# Patient Record
Sex: Female | Born: 1940
Health system: Southern US, Community
[De-identification: ages and names within clinical notes are randomized; demographics above are authoritative.]

## PROBLEM LIST (undated history)

## (undated) DIAGNOSIS — D649 Anemia, unspecified: Secondary | ICD-10-CM

## (undated) DIAGNOSIS — E079 Disorder of thyroid, unspecified: Secondary | ICD-10-CM

## (undated) DIAGNOSIS — C801 Malignant (primary) neoplasm, unspecified: Secondary | ICD-10-CM

## (undated) DIAGNOSIS — R011 Cardiac murmur, unspecified: Secondary | ICD-10-CM

## (undated) DIAGNOSIS — I639 Cerebral infarction, unspecified: Secondary | ICD-10-CM

## (undated) DIAGNOSIS — E7800 Pure hypercholesterolemia, unspecified: Secondary | ICD-10-CM

## (undated) DIAGNOSIS — I10 Essential (primary) hypertension: Secondary | ICD-10-CM

## (undated) DIAGNOSIS — M810 Age-related osteoporosis without current pathological fracture: Secondary | ICD-10-CM

## (undated) DIAGNOSIS — F32A Depression, unspecified: Secondary | ICD-10-CM

## (undated) DIAGNOSIS — F410 Panic disorder [episodic paroxysmal anxiety] without agoraphobia: Secondary | ICD-10-CM

## (undated) HISTORY — PX: CATARACT EXTRACTION: SUR2

---

## 1998-08-14 ENCOUNTER — Other Ambulatory Visit: Admission: RE | Admit: 1998-08-14 | Discharge: 1998-08-14 | Payer: Self-pay | Admitting: Obstetrics and Gynecology

## 1999-08-20 ENCOUNTER — Other Ambulatory Visit: Admission: RE | Admit: 1999-08-20 | Discharge: 1999-08-20 | Payer: Self-pay | Admitting: Obstetrics and Gynecology

## 2000-08-19 ENCOUNTER — Other Ambulatory Visit: Admission: RE | Admit: 2000-08-19 | Discharge: 2000-08-19 | Payer: Self-pay | Admitting: *Deleted

## 2001-08-20 ENCOUNTER — Other Ambulatory Visit: Admission: RE | Admit: 2001-08-20 | Discharge: 2001-08-20 | Payer: Self-pay | Admitting: Obstetrics and Gynecology

## 2002-08-22 ENCOUNTER — Other Ambulatory Visit: Admission: RE | Admit: 2002-08-22 | Discharge: 2002-08-22 | Payer: Self-pay | Admitting: Obstetrics and Gynecology

## 2002-09-13 ENCOUNTER — Ambulatory Visit (HOSPITAL_COMMUNITY): Admission: RE | Admit: 2002-09-13 | Discharge: 2002-09-13 | Payer: Self-pay | Admitting: Gastroenterology

## 2003-09-07 ENCOUNTER — Other Ambulatory Visit: Admission: RE | Admit: 2003-09-07 | Discharge: 2003-09-07 | Payer: Self-pay | Admitting: Obstetrics and Gynecology

## 2004-02-21 ENCOUNTER — Ambulatory Visit (HOSPITAL_COMMUNITY): Admission: RE | Admit: 2004-02-21 | Discharge: 2004-02-21 | Payer: Self-pay | Admitting: Allergy

## 2004-04-05 ENCOUNTER — Ambulatory Visit: Payer: Self-pay | Admitting: Pulmonary Disease

## 2004-05-14 ENCOUNTER — Ambulatory Visit: Payer: Self-pay | Admitting: Pulmonary Disease

## 2004-05-24 ENCOUNTER — Encounter: Admission: RE | Admit: 2004-05-24 | Discharge: 2004-05-24 | Payer: Self-pay | Admitting: Internal Medicine

## 2004-07-02 ENCOUNTER — Ambulatory Visit: Payer: Self-pay | Admitting: Pulmonary Disease

## 2004-09-20 ENCOUNTER — Ambulatory Visit: Payer: Self-pay | Admitting: Pulmonary Disease

## 2005-03-06 ENCOUNTER — Ambulatory Visit: Payer: Self-pay | Admitting: Pulmonary Disease

## 2006-07-21 ENCOUNTER — Encounter: Admission: RE | Admit: 2006-07-21 | Discharge: 2006-07-21 | Payer: Self-pay | Admitting: Internal Medicine

## 2006-11-05 ENCOUNTER — Ambulatory Visit: Payer: Self-pay | Admitting: Emergency Medicine

## 2008-12-06 ENCOUNTER — Ambulatory Visit (HOSPITAL_COMMUNITY): Admission: RE | Admit: 2008-12-06 | Discharge: 2008-12-06 | Payer: Self-pay | Admitting: Obstetrics and Gynecology

## 2009-02-01 ENCOUNTER — Encounter: Admission: RE | Admit: 2009-02-01 | Discharge: 2009-02-01 | Payer: Self-pay | Admitting: Internal Medicine

## 2009-02-02 ENCOUNTER — Inpatient Hospital Stay (HOSPITAL_COMMUNITY): Admission: EM | Admit: 2009-02-02 | Discharge: 2009-02-06 | Payer: Self-pay | Admitting: Emergency Medicine

## 2009-03-26 ENCOUNTER — Encounter (INDEPENDENT_AMBULATORY_CARE_PROVIDER_SITE_OTHER): Payer: Self-pay | Admitting: Neurology

## 2009-03-26 ENCOUNTER — Ambulatory Visit: Payer: Self-pay

## 2009-03-26 ENCOUNTER — Ambulatory Visit: Payer: Self-pay | Admitting: Cardiology

## 2009-03-26 ENCOUNTER — Ambulatory Visit (HOSPITAL_COMMUNITY): Admission: RE | Admit: 2009-03-26 | Discharge: 2009-03-26 | Payer: Self-pay | Admitting: Neurology

## 2009-03-28 ENCOUNTER — Encounter: Admission: RE | Admit: 2009-03-28 | Discharge: 2009-03-28 | Payer: Self-pay | Admitting: Neurology

## 2009-05-18 ENCOUNTER — Ambulatory Visit (HOSPITAL_BASED_OUTPATIENT_CLINIC_OR_DEPARTMENT_OTHER): Admission: RE | Admit: 2009-05-18 | Discharge: 2009-05-18 | Payer: Self-pay | Admitting: Urology

## 2010-07-15 LAB — POCT I-STAT 4, (NA,K, GLUC, HGB,HCT)
Glucose, Bld: 97 mg/dL (ref 70–99)
HCT: 37 % (ref 36.0–46.0)
Hemoglobin: 12.6 g/dL (ref 12.0–15.0)
Potassium: 5.3 mEq/L — ABNORMAL HIGH (ref 3.5–5.1)
Sodium: 136 mEq/L (ref 135–145)

## 2010-08-01 LAB — CORTISOL-AM, BLOOD: Cortisol - AM: 16.4 ug/dL (ref 4.3–22.4)

## 2010-08-01 LAB — BASIC METABOLIC PANEL
BUN: 10 mg/dL (ref 6–23)
BUN: 13 mg/dL (ref 6–23)
BUN: 5 mg/dL — ABNORMAL LOW (ref 6–23)
BUN: 8 mg/dL (ref 6–23)
CO2: 25 mEq/L (ref 19–32)
CO2: 25 mEq/L (ref 19–32)
CO2: 25 mEq/L (ref 19–32)
CO2: 26 mEq/L (ref 19–32)
Calcium: 8 mg/dL — ABNORMAL LOW (ref 8.4–10.5)
Calcium: 8 mg/dL — ABNORMAL LOW (ref 8.4–10.5)
Calcium: 8.1 mg/dL — ABNORMAL LOW (ref 8.4–10.5)
Calcium: 9.2 mg/dL (ref 8.4–10.5)
Chloride: 83 mEq/L — ABNORMAL LOW (ref 96–112)
Chloride: 92 mEq/L — ABNORMAL LOW (ref 96–112)
Chloride: 96 mEq/L (ref 96–112)
Chloride: 97 mEq/L (ref 96–112)
Creatinine, Ser: 0.47 mg/dL (ref 0.4–1.2)
Creatinine, Ser: 0.5 mg/dL (ref 0.4–1.2)
Creatinine, Ser: 0.55 mg/dL (ref 0.4–1.2)
Creatinine, Ser: 0.56 mg/dL (ref 0.4–1.2)
GFR calc Af Amer: >60 mL/min (ref >60)
GFR calc Af Amer: >60 mL/min (ref >60)
GFR calc Af Amer: >60 mL/min (ref >60)
GFR calc Af Amer: >60 mL/min (ref >60)
GFR calc non Af Amer: >60 mL/min (ref >60)
GFR calc non Af Amer: >60 mL/min (ref >60)
GFR calc non Af Amer: >60 mL/min (ref >60)
GFR calc non Af Amer: >60 mL/min (ref >60)
Glucose, Bld: 101 mg/dL — ABNORMAL HIGH (ref 70–99)
Glucose, Bld: 103 mg/dL — ABNORMAL HIGH (ref 70–99)
Glucose, Bld: 158 mg/dL — ABNORMAL HIGH (ref 70–99)
Glucose, Bld: 90 mg/dL (ref 70–99)
Potassium: 3.4 mEq/L — ABNORMAL LOW (ref 3.5–5.1)
Potassium: 3.7 mEq/L (ref 3.5–5.1)
Potassium: 4.2 mEq/L (ref 3.5–5.1)
Potassium: 4.2 mEq/L (ref 3.5–5.1)
Sodium: 119 mEq/L — CL (ref 135–145)
Sodium: 122 mEq/L — ABNORMAL LOW (ref 135–145)
Sodium: 126 mEq/L — ABNORMAL LOW (ref 135–145)
Sodium: 128 mEq/L — ABNORMAL LOW (ref 135–145)

## 2010-08-01 LAB — DIFFERENTIAL
Basophils Absolute: 0 10*3/uL (ref 0.0–0.1)
Basophils Relative: 0 % (ref 0–1)
Eosinophils Absolute: 0 10*3/uL (ref 0.0–0.7)
Eosinophils Relative: 0 % (ref 0–5)
Lymphocytes Relative: 11 % — ABNORMAL LOW (ref 12–46)
Lymphs Abs: 1 10*3/uL (ref 0.7–4.0)
Monocytes Absolute: 0.7 10*3/uL (ref 0.1–1.0)
Monocytes Relative: 8 % (ref 3–12)
Neutro Abs: 8 10*3/uL — ABNORMAL HIGH (ref 1.7–7.7)
Neutrophils Relative %: 82 % — ABNORMAL HIGH (ref 43–77)

## 2010-08-01 LAB — URINALYSIS, ROUTINE W REFLEX MICROSCOPIC
Bilirubin Urine: NEGATIVE
Glucose, UA: NEGATIVE mg/dL
Hgb urine dipstick: NEGATIVE
Ketones, ur: NEGATIVE mg/dL
Nitrite: NEGATIVE
Protein, ur: NEGATIVE mg/dL
Specific Gravity, Urine: 1.018 (ref 1.005–1.030)
Urobilinogen, UA: 0.2 mg/dL (ref 0.0–1.0)
pH: 6 (ref 5.0–8.0)

## 2010-08-01 LAB — CBC
HCT: 34.4 % — ABNORMAL LOW (ref 36.0–46.0)
HCT: 39.3 % (ref 36.0–46.0)
Hemoglobin: 12.2 g/dL (ref 12.0–15.0)
Hemoglobin: 13.5 g/dL (ref 12.0–15.0)
MCHC: 34.3 g/dL (ref 30.0–36.0)
MCHC: 35.4 g/dL (ref 30.0–36.0)
MCV: 90.6 fL (ref 78.0–100.0)
MCV: 90.8 fL (ref 78.0–100.0)
Platelets: 323 10*3/uL (ref 150–400)
Platelets: 342 10*3/uL (ref 150–400)
RBC: 3.79 MIL/uL — ABNORMAL LOW (ref 3.87–5.11)
RBC: 4.33 MIL/uL (ref 3.87–5.11)
RDW: 13.2 % (ref 11.5–15.5)
RDW: 13.4 % (ref 11.5–15.5)
WBC: 7.6 10*3/uL (ref 4.0–10.5)
WBC: 9.9 10*3/uL (ref 4.0–10.5)

## 2010-08-01 LAB — HEPATIC FUNCTION PANEL
ALT: 23 U/L (ref 0–35)
AST: 27 U/L (ref 0–37)
Albumin: 3.4 g/dL — ABNORMAL LOW (ref 3.5–5.2)
Alkaline Phosphatase: 69 U/L (ref 39–117)
Bilirubin, Direct: 0.1 mg/dL (ref 0.0–0.3)
Indirect Bilirubin: 0.6 mg/dL (ref 0.3–0.9)
Total Bilirubin: 0.7 mg/dL (ref 0.3–1.2)
Total Protein: 5.6 g/dL — ABNORMAL LOW (ref 6.0–8.3)

## 2010-08-01 LAB — OSMOLALITY, URINE: Osmolality, Ur: 254 mOsm/kg — ABNORMAL LOW (ref 390–1090)

## 2010-08-01 LAB — OSMOLALITY: Osmolality: 248 mOsm/kg — ABNORMAL LOW (ref 275–300)

## 2010-08-01 LAB — MAGNESIUM: Magnesium: 2.1 mg/dL (ref 1.5–2.5)

## 2010-08-01 LAB — TSH: TSH: 2.182 u[IU]/mL (ref 0.350–4.500)

## 2010-08-01 LAB — AMMONIA: Ammonia: 15 umol/L (ref 11–35)

## 2010-08-01 LAB — SODIUM, URINE, RANDOM: Sodium, Ur: 20 mEq/L

## 2010-09-10 NOTE — Assessment & Plan Note (Signed)
[facility]                             PULMONARY OFFICE NOTE   JENEE, SPAUGH                       MRN:          [id number]  DATE:11/05/2006                            DOB:          1941-01-02    SUBJECTIVE:  Ms. Perrault is a very pleasant 70 year old woman who was  seen formerly by Dr. Danice Goltz for chronic cough in the setting of  allergic rhinitis and gastroesophageal reflux. She tells me that her  exertional tolerance is good. She is able to walk on a treadmill every  day. She has had persistent cough despite treatment for both her  allergic rhinitis and her GERD. Her cough did not seem to change  regardless of these interventions. She stopped her Prilosec two months  ago because she was concerned about possible side effects and her cough  remained the same. She has continued her loratadine and Nasonex.  Otherwise, she does not complain of any significant pulmonary symptoms.  She denies any overt heartburn.   MEDICATIONS:  1. Nasonex 2 sprays each nostril daily.  2. Lipitor 10 mg daily.  3. Aspirin 81 mg daily.  4. Multivitamin daily.  5. Calcium t.i.d.  6. Synthroid 0.05 mg daily.  7. Alendronate 70 mg weekly.  8. Claritin or generic loratadine 10 mg once daily.   PHYSICAL EXAMINATION:  GENERAL:  This is a thin elderly well appearing  woman in no distress.  VITAL SIGNS:  Weight 107 pounds, temperature 98.1, blood pressure  120/72, heart rate 60, SPO2 99% on room air.  HEENT:  Her posterior pharynx is without any abnormalities. She does not  have any significant evidence of post-nasal drip.  NECK:  She has no strider or lymphadenopathy.  LUNGS:  Clear to auscultation bilaterally.  HEART:  Regular rate and rhythm without murmur.  ABDOMEN:  Benign.  EXTREMITIES:  No cyanosis, clubbing, or edema.   IMPRESSION:  Chronic cough with probable influences by both post-nasal  drip and to some degree gastroesophageal reflux. She has not  missed the  omeprazole since this medication was discontinued. Her cough has  remained stable. I have asked her to continue her loratadine and  Nasonex for her allergic rhinitis, and I indicated that it is okay for  her to stay off her Prilosec. If  her cough worsens in any way, then I think we should reinitiate her  proton pump inhibitor. Ms. Dore will follow with me on an as needed  basis.     Leslye Peer, MD  Electronically Signed    RSB/MedQ  DD: 11/05/2006  DT: 11/06/2006  Job #: [id number]

## 2010-09-13 NOTE — Op Note (Signed)
   NAME:  Nichole Foster, HANSELL                          ACCOUNT NO.:  [id number]   MEDICAL RECORD NO.:  [id number]                   PATIENT TYPE:  AMB   LOCATION:  ENDO                                 FACILITY:  [facility]   PHYSICIAN:  John C. Madilyn Fireman, M.D.                 DATE OF BIRTH:  07/05/1940   DATE OF PROCEDURE:  09/13/2002  DATE OF DISCHARGE:                                 OPERATIVE REPORT   PROCEDURE:  Colonoscopy.   INDICATION FOR PROCEDURE:  Intermittent rectal bleeding in a 70 year old  patient with no recent colon screening.   DESCRIPTION OF PROCEDURE:  The patient was placed in the left lateral  decubitus position and placed on the pulse monitor with continuous low-flow  oxygen delivered by nasal cannula.  She was sedated with 75 mcg IV fentanyl  and 6 mg IV Versed.  The Olympus video colonoscope was inserted into the  rectum and advanced to the cecum, confirmed by transillumination at  McBurney's point and visualization of the ileocecal valve and appendiceal  orifice.  The prep was excellent.  The cecum, ascending, transverse, and  proximal descending colon appeared normal with no masses, polyps,  diverticula, or other mucosal abnormalities.  Within the descending colon,  there were seen several scattered diverticula and also diverticula in the  sigmoid colon with no other abnormalities noted.  The rectum appeared  normal, and retroflexed view of the anus revealed no obvious internal  hemorrhoids.  The colonoscope was then withdrawn and the patient returned to  the recovery room in stable condition.  She tolerated the procedure well,  and there were no immediate complications.   IMPRESSION:  1. Descending and sigmoid diverticulosis.  2. Otherwise, normal study.   PLAN:  Next colon screening by sigmoidoscopy in five years.                                               John C. Madilyn Fireman, M.D.    JCH/MEDQ  D:  09/13/2002  T:  09/13/2002  Job:  [id number]   cc:   Antony Madura, M.D.  [address], [address]  [place]  [place] [address]  Fax: [phone]

## 2010-09-25 ENCOUNTER — Other Ambulatory Visit: Payer: Self-pay | Admitting: Gastroenterology

## 2011-07-30 ENCOUNTER — Other Ambulatory Visit: Payer: Self-pay

## 2013-02-12 ENCOUNTER — Emergency Department (HOSPITAL_COMMUNITY): Payer: Medicare Other

## 2013-02-12 ENCOUNTER — Observation Stay (HOSPITAL_COMMUNITY)
Admission: EM | Admit: 2013-02-12 | Discharge: 2013-02-13 | Disposition: A | Discharge status: Home / Self Care | Payer: Medicare Other | Attending: Internal Medicine | Admitting: Internal Medicine

## 2013-02-12 ENCOUNTER — Encounter (HOSPITAL_COMMUNITY): Payer: Self-pay | Admitting: Emergency Medicine

## 2013-02-12 DIAGNOSIS — I10 Essential (primary) hypertension: Secondary | ICD-10-CM | POA: Insufficient documentation

## 2013-02-12 DIAGNOSIS — F410 Panic disorder [episodic paroxysmal anxiety] without agoraphobia: Secondary | ICD-10-CM

## 2013-02-12 DIAGNOSIS — E7800 Pure hypercholesterolemia, unspecified: Secondary | ICD-10-CM | POA: Insufficient documentation

## 2013-02-12 DIAGNOSIS — G9340 Encephalopathy, unspecified: Secondary | ICD-10-CM | POA: Insufficient documentation

## 2013-02-12 DIAGNOSIS — R4182 Altered mental status, unspecified: Principal | ICD-10-CM | POA: Insufficient documentation

## 2013-02-12 DIAGNOSIS — F29 Unspecified psychosis not due to a substance or known physiological condition: Secondary | ICD-10-CM | POA: Insufficient documentation

## 2013-02-12 DIAGNOSIS — R7309 Other abnormal glucose: Secondary | ICD-10-CM | POA: Insufficient documentation

## 2013-02-12 DIAGNOSIS — R011 Cardiac murmur, unspecified: Secondary | ICD-10-CM | POA: Insufficient documentation

## 2013-02-12 DIAGNOSIS — E039 Hypothyroidism, unspecified: Secondary | ICD-10-CM | POA: Insufficient documentation

## 2013-02-12 DIAGNOSIS — E871 Hypo-osmolality and hyponatremia: Secondary | ICD-10-CM | POA: Diagnosis present

## 2013-02-12 DIAGNOSIS — M810 Age-related osteoporosis without current pathological fracture: Secondary | ICD-10-CM

## 2013-02-12 HISTORY — DX: Pure hypercholesterolemia, unspecified: E78.00

## 2013-02-12 HISTORY — DX: Cerebral infarction, unspecified: I63.9

## 2013-02-12 HISTORY — DX: Cardiac murmur, unspecified: R01.1

## 2013-02-12 HISTORY — DX: Age-related osteoporosis without current pathological fracture: M81.0

## 2013-02-12 HISTORY — DX: Panic disorder (episodic paroxysmal anxiety): F41.0

## 2013-02-12 HISTORY — DX: Essential (primary) hypertension: I10

## 2013-02-12 HISTORY — DX: Disorder of thyroid, unspecified: E07.9

## 2013-02-12 LAB — COMPREHENSIVE METABOLIC PANEL
ALT: 42 U/L — ABNORMAL HIGH (ref 0–35)
Albumin: 3.8 g/dL (ref 3.5–5.2)
Alkaline Phosphatase: 59 U/L (ref 39–117)
BUN: 13 mg/dL (ref 6–23)
Chloride: 90 mEq/L — ABNORMAL LOW (ref 96–112)
Potassium: 4.5 mEq/L (ref 3.5–5.1)
Sodium: 128 mEq/L — ABNORMAL LOW (ref 135–145)
Total Bilirubin: 0.2 mg/dL — ABNORMAL LOW (ref 0.3–1.2)
Total Protein: 6.8 g/dL (ref 6.0–8.3)

## 2013-02-12 LAB — URINALYSIS, ROUTINE W REFLEX MICROSCOPIC
Bilirubin Urine: NEGATIVE
Glucose, UA: NEGATIVE mg/dL
Ketones, ur: NEGATIVE mg/dL
Leukocytes, UA: NEGATIVE
Protein, ur: NEGATIVE mg/dL
Urobilinogen, UA: 0.2 mg/dL (ref 0.0–1.0)

## 2013-02-12 LAB — CBC WITH DIFFERENTIAL/PLATELET
Basophils Absolute: 0 10*3/uL (ref 0.0–0.1)
Basophils Relative: 0 % (ref 0–1)
Eosinophils Absolute: 0.1 10*3/uL (ref 0.0–0.7)
Eosinophils Relative: 2 % (ref 0–5)
HCT: 32.6 % — ABNORMAL LOW (ref 36.0–46.0)
MCH: 31.6 pg (ref 26.0–34.0)
MCHC: 35.9 g/dL (ref 30.0–36.0)
MCV: 88.1 fL (ref 78.0–100.0)
Monocytes Absolute: 0.4 10*3/uL (ref 0.1–1.0)
Platelets: 279 10*3/uL (ref 150–400)
RDW: 12.7 % (ref 11.5–15.5)

## 2013-02-12 LAB — CREATININE, URINE, RANDOM: Creatinine, Urine: 23.25 mg/dL

## 2013-02-12 LAB — SODIUM, URINE, RANDOM: Sodium, Ur: 79 mEq/L

## 2013-02-12 LAB — AMMONIA: Ammonia: 20 umol/L (ref 11–60)

## 2013-02-12 MED ORDER — ENOXAPARIN SODIUM 40 MG/0.4ML ~~LOC~~ SOLN
40.0000 mg | SUBCUTANEOUS | Status: DC
  Filled 2013-02-12: qty 0.4

## 2013-02-12 MED ORDER — LEVOTHYROXINE SODIUM 50 MCG PO TABS
50.0000 ug | ORAL_TABLET | Freq: Every day | ORAL | Status: DC
  Administered 2013-02-13: 50 ug via ORAL
  Filled 2013-02-12 (×2): qty 1

## 2013-02-12 MED ORDER — SODIUM CHLORIDE 0.9 % IV SOLN: INTRAVENOUS | Status: DC

## 2013-02-12 MED ORDER — SODIUM CHLORIDE 0.9 % IV SOLN
INTRAVENOUS | Status: DC
  Administered 2013-02-12: 17:00:00 via INTRAVENOUS

## 2013-02-12 MED ORDER — ONDANSETRON HCL 4 MG/2ML IJ SOLN: 4.0000 mg | Freq: Four times a day (QID) | INTRAMUSCULAR | Status: DC | PRN

## 2013-02-12 MED ORDER — ASPIRIN-DIPYRIDAMOLE ER 25-200 MG PO CP12
1.0000 | ORAL_CAPSULE | Freq: Two times a day (BID) | ORAL | Status: DC
  Administered 2013-02-13: 10:00:00 1 via ORAL
  Filled 2013-02-12 (×3): qty 1

## 2013-02-12 MED ORDER — HALOPERIDOL LACTATE 5 MG/ML IJ SOLN
2.0000 mg | Freq: Four times a day (QID) | INTRAMUSCULAR | Status: DC | PRN
Start: 2013-02-12 — End: 2013-02-13
  Administered 2013-02-12: 19:00:00 2 mg via INTRAVENOUS
  Filled 2013-02-12: qty 1

## 2013-02-12 MED ORDER — DOCUSATE SODIUM 100 MG PO CAPS
100.0000 mg | ORAL_CAPSULE | Freq: Two times a day (BID) | ORAL | Status: DC
  Administered 2013-02-13: 10:00:00 100 mg via ORAL
  Filled 2013-02-12 (×2): qty 1

## 2013-02-12 MED ORDER — QUETIAPINE FUMARATE 25 MG PO TABS
25.0000 mg | ORAL_TABLET | Freq: Every day | ORAL | Status: DC
  Administered 2013-02-12: 25 mg via ORAL
  Filled 2013-02-12 (×2): qty 1

## 2013-02-12 MED ORDER — SODIUM CHLORIDE 0.9 % IV SOLN
INTRAVENOUS | Status: DC
  Administered 2013-02-13: 02:00:00 via INTRAVENOUS

## 2013-02-12 MED ORDER — ONDANSETRON HCL 4 MG PO TABS: 4.0000 mg | ORAL_TABLET | Freq: Four times a day (QID) | ORAL | Status: DC | PRN

## 2013-02-12 NOTE — Progress Notes (Signed)
Attempted to get report, RN in room with patient and will call me back. She had my direct number [phone].

## 2013-02-12 NOTE — ED Notes (Signed)
Report received, assumed care.  

## 2013-02-12 NOTE — ED Notes (Signed)
Urine collected from patient. (questionable amount for sample)

## 2013-02-12 NOTE — ED Notes (Signed)
Patient transported to CT 

## 2013-02-12 NOTE — ED Notes (Signed)
Per family, pt having episodes of confusion for 1-2 weeks. Hx of same when her sodium is low. ekg done. Pt is alert, oriented to person and place at triage

## 2013-02-12 NOTE — H&P (Signed)
[facility] History and Physical  SHANIK BROOKSHIRE UXL:[id number] DOB: November 06, 1940 DOA: 02/12/2013  Referring physician: Dr Madison Hickman PCP: Lorenda Peck, MD  Specialists: Dr   Chief Complaint: altered mental status since 2 weeks.   HPI: Nichole Foster is a 72 y.o. female with h/o hypertension, stroke was brought in by her husband  For confusion, altered mental status and agitation. She was seen by her PCP last week. No new changes in medications. On arrival to ED, she was agitated, and was found to be hyponatremic. She is confused and most of the history was availabel from the EDP and the family at bedside. She is admitted to hospitalist for further recommendations.    Review of Systems: could not be obtained secondary to confusion.   Past Medical History  Diagnosis Date  . Hypertension   . High cholesterol   . Stroke   . Heart murmur   . Osteoporosis   . Panic attack   . Thyroid disease    Past Surgical History  Procedure Laterality Date  . Cataract extraction     Social History:  reports that she has never smoked. She does not have any smokeless tobacco history on file. She reports that she does not drink alcohol or use illicit drugs.  where does patient live--home,  Can patient participate in ADLs? yes  Allergies  Allergen Reactions  . Latex Rash    "to privates"    History reviewed. No pertinent family history.   Prior to Admission medications   Medication Sig Start Date End Date Taking? Authorizing Provider  alendronate (FOSAMAX) 70 MG tablet Take 70 mg by mouth every 7 (seven) days. Take with a full glass of water on an empty stomach.   Yes Historical Provider, MD  Calcium Carbonate-Vitamin D (CALCIUM 600 + D PO) Take 1 tablet by mouth 2 (two) times daily.   Yes Historical Provider, MD  CRANBERRY PO Take 500 mg by mouth daily.   Yes Historical Provider, MD  dipyridamole-aspirin (AGGRENOX) 200-25 MG per 12 hr capsule Take 1 capsule by mouth 2 (two) times daily.    Yes Historical Provider, MD  docusate sodium (COLACE) 100 MG capsule Take 100 mg by mouth 2 (two) times daily.   Yes Historical Provider, MD  estradiol (ESTRACE) 1 MG tablet Take 1 mg by mouth every Monday, Wednesday, and Friday. Takes at bedtime only.   Yes Historical Provider, MD  FEXOFENADINE HCL PO Take 0.5 tablets by mouth daily.   Yes Historical Provider, MD  fluticasone (FLONASE) 50 MCG/ACT nasal spray Place 2 sprays into the nose daily.   Yes Historical Provider, MD  levothyroxine (SYNTHROID, LEVOTHROID) 50 MCG tablet Take 50 mcg by mouth daily before breakfast.   Yes Historical Provider, MD  Multiple Vitamin (MULTIVITAMIN WITH MINERALS) TABS tablet Take 1 tablet by mouth daily.   Yes Historical Provider, MD  simvastatin (ZOCOR) 10 MG tablet Take 10 mg by mouth at bedtime.   Yes Historical Provider, MD  UNABLE TO FIND Take 0.5 tablets by mouth daily. Med Name: lisinopril 20mg  & chlorthalidone 25mg  combo.   Yes Historical Provider, MD  UNABLE TO FIND Take 5 mLs by mouth daily. Med Name: flax mill supplement   Yes Historical Provider, MD   Physical Exam: Filed Vitals:   02/12/13 1857  BP: 143/65  Pulse: 76  Temp: 98.7 F (37.1 C)  Resp: 20    Constitutional: Vital signs reviewed.  Patient is a well-developed and well-nourished in no acute distress and  cooperative with exam. Alert and confused Head: Normocephalic and atraumatic Ear: TM normal bilaterally Eyes: PERRL, EOMI, conjunctivae normal, No scleral icterus.  Neck: Supple, Trachea midline normal ROM, No JVD, mass, thyromegaly, or carotid bruit present.  Cardiovascular: RRR, S1 normal, S2 normal, no MRG, pulses symmetric and intact bilaterally Pulmonary/Chest: normal respiratory effort, CTAB, no wheezes, rales, or rhonchi Abdominal: Soft. Non-tender, non-distended, bowel sounds are normal, no masses, organomegaly, or guarding present.  Musculoskeletal: No joint deformities, erythema, or stiffness, ROM full and no  nontender Neurological: Alert and confused, able to move all extremities, speech tangential. No facial asymmetry.   no focal motor deficit, Skin: Warm, dry and intact. No rash, cyanosis, or clubbing.  Psychiatric:agitated Labs on Admission:  Basic Metabolic Panel:  Recent Labs Lab 02/12/13 1400  NA 128*  K 4.5  CL 90*  CO2 27  GLUCOSE 135*  BUN 13  CREATININE 0.79  CALCIUM 9.9   Liver Function Tests:  Recent Labs Lab 02/12/13 1400  AST 53*  ALT 42*  ALKPHOS 59  BILITOT 0.2*  PROT 6.8  ALBUMIN 3.8   No results found for this basename: LIPASE, AMYLASE,  in the last 168 hours  Recent Labs Lab 02/12/13 1420  AMMONIA 20   CBC:  Recent Labs Lab 02/12/13 1400  WBC 4.8  NEUTROABS 3.0  HGB 11.7*  HCT 32.6*  MCV 88.1  PLT 279   Cardiac Enzymes: No results found for this basename: CKTOTAL, CKMB, CKMBINDEX, TROPONINI,  in the last 168 hours  BNP (last 3 results) No results found for this basename: PROBNP,  in the last 8760 hours CBG: No results found for this basename: GLUCAP,  in the last 168 hours  Radiological Exams on Admission: Ct Head Wo Contrast  02/12/2013   CLINICAL DATA:  Altered mental status  EXAM: CT HEAD WITHOUT CONTRAST  TECHNIQUE: Contiguous axial images were obtained from the base of the skull through the vertex without intravenous contrast.  COMPARISON:  None.  FINDINGS: Minimal chronic microvascular changes in the deep white matter. No acute intracranial abnormality. Specifically, no hemorrhage, hydrocephalus, mass lesion, acute infarction, or significant intracranial injury. No acute calvarial abnormality. Visualized paranasal sinuses and mastoids clear. Orbital soft tissues unremarkable.  IMPRESSION: No acute intracranial abnormality.   Electronically Signed   By: Charlett Nose M.D.   On: 02/12/2013 16:12    EKG: NSR with arrythmia  Assessment/Plan  Acute encephalopathy: ? Dementia/ acute psychosis vs metabolic encephalopathy from  hyponatremia.  - gentle hydrationw ith NS  - haldol as needed for agitation - seroquel started tonight - psychiatry consulted.  - SIADH work up ordered. But unlikely.   2. Anemia: normocytic.  - anemia panel pending.   3. Hypothyroidism: - TSH  - resume synthroid.   4. DVT prophylaxis.   Code Status: full code Family Communication: family at bedside Disposition Plan: pending.   Time spent: 75 min  Rohaan Durnil [facility] Pager [phone]  If 7PM-7AM, please contact night-coverage [email or web address] Password [facility] 02/12/2013, 7:31 PM

## 2013-02-12 NOTE — ED Provider Notes (Addendum)
CSN: [id number]     Arrival date & time 02/12/13  1302 History   First MD Initiated Contact with Patient 02/12/13 1315     Chief Complaint  Patient presents with  . Altered Mental Status   level V caveat altered mental status is obtained from patient's husband. (Consider location/radiation/quality/duration/timing/severity/associated sxs/prior Treatment) HPI  Patient has been confused for the past 2 weeks, worsening over the past 3 or 4 days. She has not offer any specific complaint. She was seen by Dr. Su Hilt in the office possibly 1.5 weeks ago and time and had low sodium. No treatment prior to coming here. Past Medical History  Diagnosis Date  . Hypertension   . High cholesterol   . Stroke   . Heart murmur    History reviewed. No pertinent past surgical history. History reviewed. No pertinent family history. History  Substance Use Topics  . Smoking status: Not on file  . Smokeless tobacco: Not on file  . Alcohol Use: No   OB History   Grav Para Term Preterm Abortions TAB SAB Ect Mult Living                 Review of Systems  Unable to perform ROS: Mental status change    Allergies  Review of patient's allergies indicates no known allergies.  Home Medications  No current outpatient prescriptions on file. BP 157/65  Pulse 87  Temp(Src) 97 F (36.1 C) (Oral)  Resp 20  SpO2 99% Physical Exam  Nursing note and vitals reviewed. Constitutional: She is oriented to person, place, and time. She appears well-developed and well-nourished.  Mildly agitated  HENT:  Head: Normocephalic and atraumatic.  Eyes: Conjunctivae are normal. Pupils are equal, round, and reactive to light.  Neck: Neck supple. No tracheal deviation present. No thyromegaly present.  Cardiovascular: Normal rate and regular rhythm.   No murmur heard. Pulmonary/Chest: Effort normal and breath sounds normal.  Abdominal: Soft. Bowel sounds are normal. She exhibits no distension. There is no tenderness.   Musculoskeletal: Normal range of motion. She exhibits no edema and no tenderness.  Neurological: She is oriented to person, place, and time. She has normal reflexes. No cranial nerve deficit. Coordination normal.  Speaking occasionally nonsensical terms. However oriented to name hospital and date. Follow simple commands moves all extremity well. DTRs symmetric bilaterally knee jerk ankle jerk and biceps toes are downgoing bilaterally  Skin: Skin is warm and dry. No rash noted.  Psychiatric:  Mildly agitated    ED Course  Procedures (including critical care time) Labs Review Labs Reviewed - No data to display Imaging Review No results found. Results for orders placed during the hospital encounter of 02/12/13  COMPREHENSIVE METABOLIC PANEL      Result Value Range   Sodium 128 (*) 135 - 145 mEq/L   Potassium 4.5  3.5 - 5.1 mEq/L   Chloride 90 (*) 96 - 112 mEq/L   CO2 27  19 - 32 mEq/L   Glucose, Bld 135 (*) 70 - 99 mg/dL   BUN 13  6 - 23 mg/dL   Creatinine, Ser 4.09  0.50 - 1.10 mg/dL   Calcium 9.9  8.4 - 81.1 mg/dL   Total Protein 6.8  6.0 - 8.3 g/dL   Albumin 3.8  3.5 - 5.2 g/dL   AST 53 (*) 0 - 37 U/L   ALT 42 (*) 0 - 35 U/L   Alkaline Phosphatase 59  39 - 117 U/L   Total Bilirubin 0.2 (*) 0.3 -  1.2 mg/dL   GFR calc non Af Amer 81 (*) >90 mL/min   GFR calc Af Amer >90  >90 mL/min  CBC WITH DIFFERENTIAL      Result Value Range   WBC 4.8  4.0 - 10.5 K/uL   RBC 3.70 (*) 3.87 - 5.11 MIL/uL   Hemoglobin 11.7 (*) 12.0 - 15.0 g/dL   HCT 16.1 (*) 09.6 - 04.5 %   MCV 88.1  78.0 - 100.0 fL   MCH 31.6  26.0 - 34.0 pg   MCHC 35.9  30.0 - 36.0 g/dL   RDW 40.9  81.1 - 91.4 %   Platelets 279  150 - 400 K/uL   Neutrophils Relative % 62  43 - 77 %   Neutro Abs 3.0  1.7 - 7.7 K/uL   Lymphocytes Relative 28  12 - 46 %   Lymphs Abs 1.3  0.7 - 4.0 K/uL   Monocytes Relative 9  3 - 12 %   Monocytes Absolute 0.4  0.1 - 1.0 K/uL   Eosinophils Relative 2  0 - 5 %   Eosinophils Absolute 0.1   0.0 - 0.7 K/uL   Basophils Relative 0  0 - 1 %   Basophils Absolute 0.0  0.0 - 0.1 K/uL  URINALYSIS, ROUTINE W REFLEX MICROSCOPIC      Result Value Range   Color, Urine YELLOW  YELLOW   APPearance CLOUDY (*) CLEAR   Specific Gravity, Urine 1.011  1.005 - 1.030   pH 7.5  5.0 - 8.0   Glucose, UA NEGATIVE  NEGATIVE mg/dL   Hgb urine dipstick NEGATIVE  NEGATIVE   Bilirubin Urine NEGATIVE  NEGATIVE   Ketones, ur NEGATIVE  NEGATIVE mg/dL   Protein, ur NEGATIVE  NEGATIVE mg/dL   Urobilinogen, UA 0.2  0.0 - 1.0 mg/dL   Nitrite NEGATIVE  NEGATIVE   Leukocytes, UA NEGATIVE  NEGATIVE  AMMONIA      Result Value Range   Ammonia 20  11 - 60 umol/L   Ct Head Wo Contrast  02/12/2013   CLINICAL DATA:  Altered mental status  EXAM: CT HEAD WITHOUT CONTRAST  TECHNIQUE: Contiguous axial images were obtained from the base of the skull through the vertex without intravenous contrast.  COMPARISON:  None.  FINDINGS: Minimal chronic microvascular changes in the deep white matter. No acute intracranial abnormality. Specifically, no hemorrhage, hydrocephalus, mass lesion, acute infarction, or significant intracranial injury. No acute calvarial abnormality. Visualized paranasal sinuses and mastoids clear. Orbital soft tissues unremarkable.  IMPRESSION: No acute intracranial abnormality.   Electronically Signed   By: Charlett Nose M.D.   On: 02/12/2013 16:12    EKG Interpretation     Ventricular Rate:  86 PR Interval:  132 QRS Duration: 84 QT Interval:  360 QTC Calculation: 430 R Axis:   69 Text Interpretation:  Sinus rhythm with marked sinus arrhythmia Possible Left atrial enlargement Cannot rule out Anterior infarct , age undetermined Abnormal ECG last tracing poor quality            4:20 PM alert speaking in nonsensical terms mildly agitated MDM  No diagnosis found. Patient is encephalopathic. Spoke with Dr.Akula plan gentle intravenous hydration sodium repletion Dx#1acute  encephalopathy #2hyponatremia #3 hyperglycemia    Doug Sou, MD 02/12/13 1629  Doug Sou, MD 02/12/13 1630

## 2013-02-13 DIAGNOSIS — E871 Hypo-osmolality and hyponatremia: Secondary | ICD-10-CM

## 2013-02-13 DIAGNOSIS — M810 Age-related osteoporosis without current pathological fracture: Secondary | ICD-10-CM

## 2013-02-13 DIAGNOSIS — F410 Panic disorder [episodic paroxysmal anxiety] without agoraphobia: Secondary | ICD-10-CM

## 2013-02-13 LAB — BASIC METABOLIC PANEL
CO2: 23 mEq/L (ref 19–32)
Calcium: 7.9 mg/dL — ABNORMAL LOW (ref 8.4–10.5)
Creatinine, Ser: 0.71 mg/dL (ref 0.50–1.10)
GFR calc Af Amer: >90 mL/min (ref >90)
Glucose, Bld: 88 mg/dL (ref 70–99)
Potassium: 4 mEq/L (ref 3.5–5.1)
Sodium: 130 mEq/L — ABNORMAL LOW (ref 135–145)

## 2013-02-13 LAB — OSMOLALITY, URINE: Osmolality, Ur: 244 mOsm/kg — ABNORMAL LOW (ref 390–1090)

## 2013-02-13 LAB — CBC
Hemoglobin: 10.3 g/dL — ABNORMAL LOW (ref 12.0–15.0)
MCV: 89.2 fL (ref 78.0–100.0)
Platelets: 230 10*3/uL (ref 150–400)
RBC: 3.33 MIL/uL — ABNORMAL LOW (ref 3.87–5.11)
WBC: 3.6 10*3/uL — ABNORMAL LOW (ref 4.0–10.5)

## 2013-02-13 MED ORDER — ENOXAPARIN SODIUM 30 MG/0.3ML ~~LOC~~ SOLN
30.0000 mg | SUBCUTANEOUS | Status: DC
  Administered 2013-02-13: 05:00:00 30 mg via SUBCUTANEOUS
  Filled 2013-02-13 (×2): qty 0.3

## 2013-02-13 MED ORDER — LISINOPRIL 20 MG PO TABS: 20.0000 mg | ORAL_TABLET | Freq: Every day | ORAL | Status: AC

## 2013-02-13 MED ORDER — LISINOPRIL 20 MG PO TABS: 20.0000 mg | ORAL_TABLET | Freq: Every day | ORAL | Status: DC

## 2013-02-13 MED ORDER — ENOXAPARIN SODIUM 40 MG/0.4ML ~~LOC~~ SOLN
40.0000 mg | SUBCUTANEOUS | Status: DC
  Filled 2013-02-13: qty 0.4

## 2013-02-13 NOTE — Plan of Care (Signed)
Problem: Phase I Progression Outcomes Goal: Voiding-avoid urinary catheter unless indicated Outcome: Completed/Met Date Met:  02/13/13 Patient voiding on her own

## 2013-02-13 NOTE — Plan of Care (Signed)
Problem: Phase III Progression Outcomes Goal: Voiding independently Outcome: Completed/Met Date Met:  02/13/13 Patient voiding on her own

## 2013-02-13 NOTE — Discharge Summary (Signed)
Physician Discharge Summary  CHARLE Foster ZOX:[id number] DOB: 08-20-1940 DOA: 02/12/2013  PCP: Nichole Peck, MD  Admit date: 02/12/2013 Discharge date: 02/13/2013  Time spent: 40 minutes  Recommendations for Outpatient Follow-up:  1. Followup with primary care physician exam.  Discharge Diagnoses:  Active Problems:   Hyponatremia   Acute encephalopathy   Discharge Condition: Stable  Diet recommendation: Heart healthy diet  Filed Weights   02/12/13 1857  Weight: 47.1 kg (103 lb 13.4 oz)    History of present illness:  Nichole Foster is a 72 y.o. female with h/o hypertension, stroke was brought in by her husband For confusion, altered mental status and agitation. She was seen by her PCP last week. No new changes in medications. On arrival to ED, she was agitated, and was found to be hyponatremic. She is confused and most of the history was availabel from the EDP and the family at bedside. She is admitted to hospitalist for further recommendations.   Hospital Course:   1. Acute encephalopathy: Likely patient has dementia with acute delirium. This was thought initially to be secondary to hyponatremia, patient is been hyponatremic for quite some time now, since 2010 patient has either low or low-normal sodium. I am not sure exactly what the cause of acute renal ileum, she does not have infection, sodium could be contributing. She is back to her normal self, husband bedside confirming that she is back to her baseline. Could be multiple factors including hyponatremia, mild dehydration his the setting of dementia.  2. Hyponatremia: As mentioned above since 2010 patient is either has low or low normal sodium, she is on chlorthalidone, this is discontinued. Presented with sodium of 128, sodium today is 130 after gentle normal saline hydration, urinary sodium is 79, but that was done while patient is taking chlorthalidone.  3. Hypothyroidism: Her TSH continues to the hospital  stay.  4. Hypertension: Patient is on lisinopril and chlorthalidone, on discharge chlorthalidone discontinued patient to continue lisinopril. Because of hyponatremia.  Procedures:  None  Consultations:  None  Discharge Exam: Filed Vitals:   02/13/13 0522  BP: 153/71  Pulse: 76  Temp: 98.2 F (36.8 C)  Resp: 16   General: Alert and awake, oriented x3, not in any acute distress. HEENT: anicteric sclera, pupils reactive to light and accommodation, EOMI CVS: S1-S2 clear, no murmur rubs or gallops Chest: clear to auscultation bilaterally, no wheezing, rales or rhonchi Abdomen: soft nontender, nondistended, normal bowel sounds, no organomegaly Extremities: no cyanosis, clubbing or edema noted bilaterally Neuro: Cranial nerves II-XII intact, no focal neurological deficits  Discharge Instructions  Discharge Orders   Future Orders Complete By Expires   Diet - low sodium heart healthy  As directed    Increase activity slowly  As directed        Medication List    STOP taking these medications       UNABLE TO FIND      TAKE these medications       alendronate 70 MG tablet  Commonly known as:  FOSAMAX  Take 70 mg by mouth every 7 (seven) days. Take with a full glass of water on an empty stomach.     CALCIUM 600 + D PO  Take 1 tablet by mouth 2 (two) times daily.     COLACE 100 MG capsule  Generic drug:  docusate sodium  Take 100 mg by mouth 2 (two) times daily.     CRANBERRY PO  Take 500 mg by mouth  daily.     dipyridamole-aspirin 200-25 MG per 12 hr capsule  Commonly known as:  AGGRENOX  Take 1 capsule by mouth 2 (two) times daily.     estradiol 1 MG tablet  Commonly known as:  ESTRACE  Take 1 mg by mouth every Monday, Wednesday, and Friday. Takes at bedtime only.     FEXOFENADINE HCL PO  Take 0.5 tablets by mouth daily.     fluticasone 50 MCG/ACT nasal spray  Commonly known as:  FLONASE  Place 2 sprays into the Foster daily.     levothyroxine 50 MCG  tablet  Commonly known as:  SYNTHROID, LEVOTHROID  Take 50 mcg by mouth daily before breakfast.     lisinopril 20 MG tablet  Commonly known as:  PRINIVIL,ZESTRIL  Take 1 tablet (20 mg total) by mouth daily.     multivitamin with minerals Tabs tablet  Take 1 tablet by mouth daily.     simvastatin 10 MG tablet  Commonly known as:  ZOCOR  Take 10 mg by mouth at bedtime.     UNABLE TO FIND  Take 5 mLs by mouth daily. Med Name: flax mill supplement       Allergies  Allergen Reactions  . Latex Rash    "to privates"       Follow-up Information   Follow up with ROBERTS, Vernie Ammons, MD.   Specialty:  Internal Medicine   Contact information:   [address] [place] [address] [phone]        The results of significant diagnostics from this hospitalization (including imaging, microbiology, ancillary and laboratory) are listed below for reference.    Significant Diagnostic Studies: Ct Head Wo Contrast  02/12/2013   CLINICAL DATA:  Altered mental status  EXAM: CT HEAD WITHOUT CONTRAST  TECHNIQUE: Contiguous axial images were obtained from the base of the skull through the vertex without intravenous contrast.  COMPARISON:  None.  FINDINGS: Minimal chronic microvascular changes in the deep white matter. No acute intracranial abnormality. Specifically, no hemorrhage, hydrocephalus, mass lesion, acute infarction, or significant intracranial injury. No acute calvarial abnormality. Visualized paranasal sinuses and mastoids clear. Orbital soft tissues unremarkable.  IMPRESSION: No acute intracranial abnormality.   Electronically Signed   By: Nichole Foster M.D.   On: 02/12/2013 16:12    Microbiology: No results found for this or any previous visit (from the past 240 hour(s)).   Labs: Basic Metabolic Panel:  Recent Labs Lab 02/12/13 1400 02/13/13 0455  NA 128* 130*  K 4.5 4.0  CL 90* 98  CO2 27 23  GLUCOSE 135* 88  BUN 13 9  CREATININE 0.79 0.71  CALCIUM  9.9 7.9*   Liver Function Tests:  Recent Labs Lab 02/12/13 1400  AST 53*  ALT 42*  ALKPHOS 59  BILITOT 0.2*  PROT 6.8  ALBUMIN 3.8   No results found for this basename: LIPASE, AMYLASE,  in the last 168 hours  Recent Labs Lab 02/12/13 1420  AMMONIA 20   CBC:  Recent Labs Lab 02/12/13 1400 02/13/13 0455  WBC 4.8 3.6*  NEUTROABS 3.0  --   HGB 11.7* 10.3*  HCT 32.6* 29.7*  MCV 88.1 89.2  PLT 279 230   Cardiac Enzymes: No results found for this basename: CKTOTAL, CKMB, CKMBINDEX, TROPONINI,  in the last 168 hours BNP: BNP (last 3 results) No results found for this basename: PROBNP,  in the last 8760 hours CBG: No results found for this basename: GLUCAP,  in  the last 168 hours     Signed:  Yukari Flax A  [facility] 02/13/2013, 11:26 AM

## 2013-02-13 NOTE — Plan of Care (Signed)
Problem: Phase I Progression Outcomes Goal: OOB as tolerated unless otherwise ordered Outcome: Completed/Met Date Met:  02/13/13 Patient out of bed to bedside commode

## 2013-02-18 ENCOUNTER — Encounter (HOSPITAL_COMMUNITY): Payer: Self-pay | Admitting: Emergency Medicine

## 2013-02-18 ENCOUNTER — Emergency Department (HOSPITAL_COMMUNITY): Payer: Medicare Other

## 2013-02-18 ENCOUNTER — Inpatient Hospital Stay (HOSPITAL_COMMUNITY)
Admission: EM | Admit: 2013-02-18 | Discharge: 2013-02-28 | DRG: 071 | Disposition: A | Discharge status: Other Acute Inpatient Hospital | Payer: Medicare Other | Attending: Internal Medicine | Admitting: Internal Medicine

## 2013-02-18 DIAGNOSIS — E079 Disorder of thyroid, unspecified: Secondary | ICD-10-CM | POA: Diagnosis present

## 2013-02-18 DIAGNOSIS — E7800 Pure hypercholesterolemia, unspecified: Secondary | ICD-10-CM | POA: Diagnosis present

## 2013-02-18 DIAGNOSIS — R4182 Altered mental status, unspecified: Secondary | ICD-10-CM

## 2013-02-18 DIAGNOSIS — F410 Panic disorder [episodic paroxysmal anxiety] without agoraphobia: Secondary | ICD-10-CM | POA: Diagnosis present

## 2013-02-18 DIAGNOSIS — E871 Hypo-osmolality and hyponatremia: Secondary | ICD-10-CM | POA: Diagnosis present

## 2013-02-18 DIAGNOSIS — R1310 Dysphagia, unspecified: Secondary | ICD-10-CM | POA: Diagnosis present

## 2013-02-18 DIAGNOSIS — K1370 Unspecified lesions of oral mucosa: Secondary | ICD-10-CM | POA: Diagnosis present

## 2013-02-18 DIAGNOSIS — H5316 Psychophysical visual disturbances: Secondary | ICD-10-CM | POA: Diagnosis present

## 2013-02-18 DIAGNOSIS — F22 Delusional disorders: Secondary | ICD-10-CM | POA: Diagnosis present

## 2013-02-18 DIAGNOSIS — Z79899 Other long term (current) drug therapy: Secondary | ICD-10-CM

## 2013-02-18 DIAGNOSIS — Z8673 Personal history of transient ischemic attack (TIA), and cerebral infarction without residual deficits: Secondary | ICD-10-CM

## 2013-02-18 DIAGNOSIS — E039 Hypothyroidism, unspecified: Secondary | ICD-10-CM | POA: Diagnosis present

## 2013-02-18 DIAGNOSIS — G9340 Encephalopathy, unspecified: Principal | ICD-10-CM | POA: Diagnosis present

## 2013-02-18 DIAGNOSIS — R410 Disorientation, unspecified: Secondary | ICD-10-CM

## 2013-02-18 DIAGNOSIS — A419 Sepsis, unspecified organism: Secondary | ICD-10-CM

## 2013-02-18 DIAGNOSIS — F29 Unspecified psychosis not due to a substance or known physiological condition: Secondary | ICD-10-CM | POA: Diagnosis present

## 2013-02-18 DIAGNOSIS — F411 Generalized anxiety disorder: Secondary | ICD-10-CM | POA: Diagnosis present

## 2013-02-18 DIAGNOSIS — M810 Age-related osteoporosis without current pathological fracture: Secondary | ICD-10-CM | POA: Diagnosis present

## 2013-02-18 DIAGNOSIS — I10 Essential (primary) hypertension: Secondary | ICD-10-CM | POA: Diagnosis present

## 2013-02-18 DIAGNOSIS — J029 Acute pharyngitis, unspecified: Secondary | ICD-10-CM | POA: Diagnosis present

## 2013-02-18 LAB — URINALYSIS, ROUTINE W REFLEX MICROSCOPIC
Bilirubin Urine: NEGATIVE
Glucose, UA: NEGATIVE mg/dL
Hgb urine dipstick: NEGATIVE
Specific Gravity, Urine: 1.007 (ref 1.005–1.030)
Urobilinogen, UA: 0.2 mg/dL (ref 0.0–1.0)
pH: 7.5 (ref 5.0–8.0)

## 2013-02-18 LAB — CBC WITH DIFFERENTIAL/PLATELET
Basophils Absolute: 0 10*3/uL (ref 0.0–0.1)
Basophils Relative: 1 % (ref 0–1)
Eosinophils Absolute: 0 10*3/uL (ref 0.0–0.7)
Hemoglobin: 11.5 g/dL — ABNORMAL LOW (ref 12.0–15.0)
Lymphocytes Relative: 16 % (ref 12–46)
MCH: 31.2 pg (ref 26.0–34.0)
MCHC: 35.5 g/dL (ref 30.0–36.0)
Monocytes Relative: 11 % (ref 3–12)
Neutro Abs: 4.4 10*3/uL (ref 1.7–7.7)
Neutrophils Relative %: 72 % (ref 43–77)
Platelets: 330 10*3/uL (ref 150–400)
RBC: 3.69 MIL/uL — ABNORMAL LOW (ref 3.87–5.11)
WBC: 6 10*3/uL (ref 4.0–10.5)

## 2013-02-18 LAB — COMPREHENSIVE METABOLIC PANEL
ALT: 37 U/L — ABNORMAL HIGH (ref 0–35)
AST: 44 U/L — ABNORMAL HIGH (ref 0–37)
Albumin: 4.2 g/dL (ref 3.5–5.2)
Alkaline Phosphatase: 62 U/L (ref 39–117)
BUN: 11 mg/dL (ref 6–23)
Chloride: 89 mEq/L — ABNORMAL LOW (ref 96–112)
GFR calc Af Amer: >90 mL/min (ref >90)
GFR calc non Af Amer: 85 mL/min — ABNORMAL LOW (ref >90)
Glucose, Bld: 144 mg/dL — ABNORMAL HIGH (ref 70–99)
Potassium: 4.6 mEq/L (ref 3.5–5.1)
Sodium: 125 mEq/L — ABNORMAL LOW (ref 135–145)
Total Bilirubin: 0.4 mg/dL (ref 0.3–1.2)
Total Protein: 7.3 g/dL (ref 6.0–8.3)

## 2013-02-18 MED ORDER — SODIUM CHLORIDE 0.9 % IJ SOLN
3.0000 mL | Freq: Two times a day (BID) | INTRAMUSCULAR | Status: DC
  Administered 2013-02-19 – 2013-02-27 (×5): 3 mL via INTRAVENOUS

## 2013-02-18 MED ORDER — SODIUM CHLORIDE 0.9 % IV SOLN
INTRAVENOUS | Status: AC
  Administered 2013-02-19: 04:00:00 via INTRAVENOUS

## 2013-02-18 MED ORDER — LORAZEPAM 2 MG/ML IJ SOLN: 0.5000 mg | Freq: Once | INTRAMUSCULAR | Status: DC

## 2013-02-18 MED ORDER — SODIUM CHLORIDE 0.9 % IV BOLUS (SEPSIS)
500.0000 mL | Freq: Once | INTRAVENOUS | Status: AC
  Administered 2013-02-18: 500 mL via INTRAVENOUS

## 2013-02-18 MED ORDER — LISINOPRIL 20 MG PO TABS
20.0000 mg | ORAL_TABLET | Freq: Every day | ORAL | Status: DC
  Administered 2013-02-21 – 2013-02-28 (×8): 20 mg via ORAL
  Filled 2013-02-18 (×10): qty 1

## 2013-02-18 MED ORDER — ASPIRIN EC 81 MG PO TBEC
81.0000 mg | DELAYED_RELEASE_TABLET | Freq: Every day | ORAL | Status: DC
  Administered 2013-02-20 – 2013-02-28 (×9): 81 mg via ORAL
  Filled 2013-02-18 (×10): qty 1

## 2013-02-18 MED ORDER — DOCUSATE SODIUM 100 MG PO CAPS
100.0000 mg | ORAL_CAPSULE | Freq: Two times a day (BID) | ORAL | Status: DC
  Administered 2013-02-19 – 2013-02-24 (×10): 100 mg via ORAL
  Filled 2013-02-18 (×13): qty 1

## 2013-02-18 MED ORDER — SODIUM CHLORIDE 0.9 % IV BOLUS (SEPSIS): 500.0000 mL | Freq: Once | INTRAVENOUS | Status: DC

## 2013-02-18 MED ORDER — LEVOTHYROXINE SODIUM 50 MCG PO TABS
50.0000 ug | ORAL_TABLET | Freq: Every day | ORAL | Status: DC
  Administered 2013-02-20 – 2013-02-28 (×9): 50 ug via ORAL
  Filled 2013-02-18 (×13): qty 1

## 2013-02-18 MED ORDER — ONDANSETRON HCL 4 MG/2ML IJ SOLN
4.0000 mg | Freq: Four times a day (QID) | INTRAMUSCULAR | Status: DC | PRN
  Administered 2013-02-19: 4 mg via INTRAVENOUS
  Filled 2013-02-18: qty 2

## 2013-02-18 MED ORDER — ENOXAPARIN SODIUM 30 MG/0.3ML ~~LOC~~ SOLN
30.0000 mg | SUBCUTANEOUS | Status: DC
  Administered 2013-02-19 – 2013-02-28 (×10): 30 mg via SUBCUTANEOUS
  Filled 2013-02-18 (×10): qty 0.3

## 2013-02-18 MED ORDER — ONDANSETRON HCL 4 MG PO TABS
4.0000 mg | ORAL_TABLET | Freq: Four times a day (QID) | ORAL | Status: DC | PRN
  Administered 2013-02-24: 4 mg via ORAL
  Filled 2013-02-18: qty 1

## 2013-02-18 MED ORDER — ASPIRIN-DIPYRIDAMOLE ER 25-200 MG PO CP12
1.0000 | ORAL_CAPSULE | Freq: Two times a day (BID) | ORAL | Status: DC
  Administered 2013-02-19 – 2013-02-28 (×18): 1 via ORAL
  Filled 2013-02-18 (×22): qty 1

## 2013-02-18 MED ORDER — LORAZEPAM 2 MG/ML IJ SOLN
2.0000 mg | INTRAMUSCULAR | Status: DC | PRN
  Administered 2013-02-19: 2 mg via INTRAVENOUS
  Filled 2013-02-18: qty 1

## 2013-02-18 NOTE — ED Notes (Signed)
Pt states "I am having a panic attack." Pt states "right brain reverse it left brain reverse right brain." pt responds to name. Pt states she wants to door open. Pt confused and not using appropriate words and not making sense with thoughts. Husband at bedside states she has not stopped.

## 2013-02-18 NOTE — ED Notes (Signed)
Assisted with pt in/out catherization. Pt tolerated well.

## 2013-02-18 NOTE — ED Notes (Signed)
Report to Kimberly RN

## 2013-02-18 NOTE — ED Notes (Signed)
Pt returned from CT.  Pt was calm through CT scan, able to be completed. Husband was hungry, was given crackers and peanut butter.  He told me he had given her some to eat as well.  Asked him not to continue to feed her

## 2013-02-18 NOTE — Consult Note (Addendum)
NEURO HOSPITALIST CONSULT NOTE    Reason for Consult: altered mental status  HPI:                                                                                                                                          Nichole Foster is an 72 y.o. female, right handed, with a past medical histroy significant for HTN, hypercholesterolemia, stroke without apparent residual deficits, panic attacks, thyroid disease, osteoporosis, prior admissions to [facility] due to metabolic encephalopathy, brought to [facility] ED by husband due to progressive, worsening confusion and agitation. She is delirious at this moment and unable to provide reliable clinical information, and therefore all history is obtained from her husband. According to patient's husband, Mrs. Nichole Foster has been progressively confused and intermittently agitated for couple of months, with daily visual hallucinations as well as decline in the content of her conversations, insight and judgment. Disrupted sleep pattern with ability to sleep only few hours. No eating well and is losing weight. No falls or bladder impairment. He indicated that in the past 24 hours her confusion had gotten very extreme, and she is constantly repeating the same things over and over again. No recent fever or infection. No new medications. Her work up so far includes unrevealing CT brain, U/A without evidence of infection, normal white count, but hyponatremia 125.     Past Medical History  Diagnosis Date  . Hypertension   . High cholesterol   . Stroke   . Heart murmur   . Osteoporosis   . Panic attack   . Thyroid disease     Past Surgical History  Procedure Laterality Date  . Cataract extraction      No family history on file.  Social History:  reports that she has never smoked. She has never used smokeless tobacco. She reports that she does not drink alcohol or use illicit drugs.  Allergies  Allergen Reactions  . Latex Rash    "to privates"     MEDICATIONS:                                                                                                                     I have reviewed the patient's current medications.   ROS: unable to obtain due to mental status.  History obtained from chart review and husband.    Physical exam: confused, restless. Blood pressure 190/74, pulse 90, temperature 97.6 F (36.4 C), temperature source Oral, resp. rate 22, weight 46.131 kg (101 lb 11.2 oz), SpO2 100.00%. Head: normocephalic. Neck: supple, no bruits, no JVD. Cardiac: no murmurs. Lungs: clear. Abdomen: soft, no tender, no mass. Extremities: no edema.  Neurologic Examination:  Limited due to agitation                                                                                                   Mental Status: Alert, awake, knows this is [facility], disoriented to year.  Speech fluent without evidence of aphasia.  Able to follow 3 step commands without difficulty. Cranial Nerves: II: Discs flat bilaterally; Visual fields grossly normal, pupils equal, round, reactive to light and accommodation III,IV, VI: ptosis not present, extra-ocular motions intact bilaterally V,VII: smile symmetric, facial light touch sensation normal bilaterally VIII: hearing normal bilaterally IX,X: gag reflex present XI: bilateral shoulder shrug XII: midline tongue extension without atrophy or fasciculations  Motor: Seem to move all limbs symmetrically. Sensory: unreliable Deep Tendon Reflexes:  1+ all over Plantars: Right: downgoing   Left: downgoing Cerebellar: Unable to test at this moment Gait:  Unable to test CV: pulses palpable throughout  No meningeal signs.  No results found for this basename: cbc, bmp, coags, chol, tri, ldl, hga1c    Results for orders placed during the hospital encounter of  02/18/13 (from the past 48 hour(s))  CBC WITH DIFFERENTIAL     Status: Abnormal   Collection Time    02/18/13  6:25 PM      Result Value Range   WBC 6.0  4.0 - 10.5 K/uL   RBC 3.69 (*) 3.87 - 5.11 MIL/uL   Hemoglobin 11.5 (*) 12.0 - 15.0 g/dL   HCT 16.1 (*) 09.6 - 04.5 %   MCV 87.8  78.0 - 100.0 fL   MCH 31.2  26.0 - 34.0 pg   MCHC 35.5  30.0 - 36.0 g/dL   RDW 40.9  81.1 - 91.4 %   Platelets 330  150 - 400 K/uL   Neutrophils Relative % 72  43 - 77 %   Neutro Abs 4.4  1.7 - 7.7 K/uL   Lymphocytes Relative 16  12 - 46 %   Lymphs Abs 1.0  0.7 - 4.0 K/uL   Monocytes Relative 11  3 - 12 %   Monocytes Absolute 0.6  0.1 - 1.0 K/uL   Eosinophils Relative 0  0 - 5 %   Eosinophils Absolute 0.0  0.0 - 0.7 K/uL   Basophils Relative 1  0 - 1 %   Basophils Absolute 0.0  0.0 - 0.1 K/uL  COMPREHENSIVE METABOLIC PANEL     Status: Abnormal   Collection Time    02/18/13  6:25 PM      Result Value Range   Sodium 125 (*) 135 - 145 mEq/L   Potassium 4.6  3.5 - 5.1 mEq/L   Chloride 89 (*) 96 - 112 mEq/L   CO2 25  19 - 32 mEq/L   Glucose, Bld 144 (*) 70 - 99 mg/dL   BUN 11  6 - 23 mg/dL   Creatinine, Ser 4.54  0.50 - 1.10 mg/dL   Calcium 9.8  8.4 - 09.8 mg/dL   Total Protein 7.3  6.0 - 8.3 g/dL   Albumin 4.2  3.5 - 5.2 g/dL   AST 44 (*) 0 - 37 U/L   ALT 37 (*) 0 - 35 U/L   Alkaline Phosphatase 62  39 - 117 U/L   Total Bilirubin 0.4  0.3 - 1.2 mg/dL   GFR calc non Af Amer 85 (*) >90 mL/min   GFR calc Af Amer >90  >90 mL/min   Comment: (NOTE)     The eGFR has been calculated using the CKD EPI equation.     This calculation has not been validated in all clinical situations.     eGFR's persistently <90 mL/min signify possible Chronic Kidney     Disease.  SODIUM, URINE, RANDOM     Status: None   Collection Time    02/18/13  8:48 PM      Result Value Range   Sodium, Ur 81    CREATININE, URINE, RANDOM     Status: None   Collection Time    02/18/13  8:48 PM      Result Value Range    Creatinine, Urine 27.34    URINALYSIS, ROUTINE W REFLEX MICROSCOPIC     Status: Abnormal   Collection Time    02/18/13  8:49 PM      Result Value Range   Color, Urine YELLOW  YELLOW   APPearance CLEAR  CLEAR   Specific Gravity, Urine 1.007  1.005 - 1.030   pH 7.5  5.0 - 8.0   Glucose, UA NEGATIVE  NEGATIVE mg/dL   Hgb urine dipstick NEGATIVE  NEGATIVE   Bilirubin Urine NEGATIVE  NEGATIVE   Ketones, ur 15 (*) NEGATIVE mg/dL   Protein, ur NEGATIVE  NEGATIVE mg/dL   Urobilinogen, UA 0.2  0.0 - 1.0 mg/dL   Nitrite NEGATIVE  NEGATIVE   Leukocytes, UA NEGATIVE  NEGATIVE   Comment: MICROSCOPIC NOT DONE ON URINES WITH NEGATIVE PROTEIN, BLOOD, LEUKOCYTES, NITRITE, OR GLUCOSE <1000 mg/dL.    Ct Head Wo Contrast  02/18/2013   CLINICAL DATA:  Altered mental status.  Confusion.  EXAM: CT HEAD WITHOUT CONTRAST  TECHNIQUE: Contiguous axial images were obtained from the base of the skull through the vertex without intravenous contrast.  COMPARISON:  02/12/2013.  FINDINGS: No mass lesion, mass effect, midline shift, hydrocephalus, hemorrhage. No acute territorial cortical ischemia/infarct. Atrophy and chronic ischemic white matter disease is present.Paranasal sinuses appear within normal limits. Mastoid air cells are clear. There is low attenuation of the intravascular compartment with increased contrast between the dura and sagittal sinus suggesting anemia.  IMPRESSION: 1. Mild atrophy and chronic ischemic white matter disease without acute intracranial abnormality. No interval change. 2. Probable anemia.   Electronically Signed   By: Andreas Newport M.D.   On: 02/18/2013 20:28   Dg Chest Port 1 View  02/18/2013   CLINICAL DATA:  Low sodium, weakness, confusion  EXAM: PORTABLE CHEST - 1 VIEW  COMPARISON:  02/02/2009  FINDINGS: Mild left-sided cardiac enlargement. Vascular pattern normal. Lungs clear.  IMPRESSION: No active disease.   Electronically Signed   By: Esperanza Heir M.D.   On: 02/18/2013 19:17      Assessment/Plan: 72 y/o with progressive confusional state with associated visual hallucinations,  declining content of conversations, comes in today with agitated delirium.' Suspect underlying dementia with superimposed agitated delirium due to hyponatremia. Recommend: MRI brain. EEG. Check serum B12, TSH, RPR. Will follow up.  Wyatt Portela, MD [facility] [phone]  02/18/2013, 10:28 PM

## 2013-02-18 NOTE — ED Notes (Signed)
Onset 6 days ago discharged low sodium and continuous talking. Seen Primary Doctor took labs and sodium was low sent to hospital for evaluation.

## 2013-02-18 NOTE — ED Provider Notes (Signed)
CSN: [id number]     Arrival date & time 02/18/13  1802 History   First MD Initiated Contact with Patient 02/18/13 1853     Chief Complaint  Patient presents with  . Abnormal Lab  . Panic Attack   (Consider location/radiation/quality/duration/timing/severity/associated sxs/prior Treatment) HPI Comments: 72 yo female with panic attack, hypoNa hx presents with worsening confusion the past 5 days since discharge for Low Na and encephalopathy.  No new medicines.  Husband and pt deny excessive free water intake.  Similar to previous.  Pt confused, speech clear but not making sense, constant, nothing improves.  No known psych hx.  No fevers or neck stiffness.  The history is provided by the patient and the spouse.    Past Medical History  Diagnosis Date  . Hypertension   . High cholesterol   . Stroke   . Heart murmur   . Osteoporosis   . Panic attack   . Thyroid disease    Past Surgical History  Procedure Laterality Date  . Cataract extraction     No family history on file. History  Substance Use Topics  . Smoking status: Never Smoker   . Smokeless tobacco: Never Used  . Alcohol Use: No   OB History   Grav Para Term Preterm Abortions TAB SAB Ect Mult Living                 Review of Systems  Unable to perform ROS: Mental status change    Allergies  Latex  Home Medications   Current Outpatient Rx  Name  Route  Sig  Dispense  Refill  . alendronate (FOSAMAX) 70 MG tablet   Oral   Take 70 mg by mouth every 7 (seven) days. Take with a full glass of water on an empty stomach.         . Calcium Carbonate-Vitamin D (CALCIUM 600 + D PO)   Oral   Take 1 tablet by mouth 2 (two) times daily.         Marland Kitchen CRANBERRY PO   Oral   Take 500 mg by mouth daily.         Marland Kitchen dipyridamole-aspirin (AGGRENOX) 200-25 MG per 12 hr capsule   Oral   Take 1 capsule by mouth 2 (two) times daily.         Marland Kitchen docusate sodium (COLACE) 100 MG capsule   Oral   Take 100 mg by mouth 2 (two)  times daily.         Marland Kitchen ESTRACE VAGINAL 0.1 MG/GM vaginal cream   Vaginal   Place 2 g vaginally daily.          Marland Kitchen estradiol (ESTRACE) 1 MG tablet   Oral   Take 1 mg by mouth every Monday, Wednesday, and Friday. Takes at bedtime only.         . fexofenadine (ALLEGRA) 180 MG tablet   Oral   Take 90 mg by mouth daily.         . fluticasone (FLONASE) 50 MCG/ACT nasal spray   Nasal   Place 2 sprays into the nose daily.         Marland Kitchen levothyroxine (SYNTHROID, LEVOTHROID) 50 MCG tablet   Oral   Take 50 mcg by mouth daily before breakfast.         . lisinopril (PRINIVIL,ZESTRIL) 20 MG tablet   Oral   Take 1 tablet (20 mg total) by mouth daily.   30 tablet   0   .  Multiple Vitamin (MULTIVITAMIN WITH MINERALS) TABS tablet   Oral   Take 1 tablet by mouth daily.         . simvastatin (ZOCOR) 10 MG tablet   Oral   Take 10 mg by mouth at bedtime.         Marland Kitchen UNABLE TO FIND   Oral   Take 5 mLs by mouth daily. Med Name: flax mill supplement          BP 144/66  Pulse 84  Resp 20  SpO2 100% Physical Exam  Nursing note and vitals reviewed. Constitutional: She appears well-developed and well-nourished.  HENT:  Head: Normocephalic and atraumatic.  Dry mm  Eyes: Conjunctivae are normal. Right eye exhibits no discharge. Left eye exhibits no discharge.  Neck: Normal range of motion. Neck supple. No tracheal deviation present.  Cardiovascular: Normal rate and regular rhythm.   Pulmonary/Chest: Effort normal and breath sounds normal.  Abdominal: Soft. She exhibits no distension. There is no tenderness. There is no guarding.  Musculoskeletal: She exhibits no edema.  Neurological: She is alert. GCS eye subscore is 4. GCS verbal subscore is 3. GCS motor subscore is 6.  Moves ext equal bilat UE and LE Gross sensation intact Finger nose mild dysmetria bilateral Pt having visual hallucinations Responses are disordered  Skin: Skin is warm. No rash noted.  Psychiatric: She  has a normal mood and affect. Her speech is rapid and/or pressured and tangential. She is actively hallucinating.    ED Course  Procedures (including critical care time) Labs Review Labs Reviewed  CBC WITH DIFFERENTIAL - Abnormal; Notable for the following:    RBC 3.69 (*)    Hemoglobin 11.5 (*)    HCT 32.4 (*)    All other components within normal limits  COMPREHENSIVE METABOLIC PANEL - Abnormal; Notable for the following:    Sodium 125 (*)    Chloride 89 (*)    Glucose, Bld 144 (*)    AST 44 (*)    ALT 37 (*)    GFR calc non Af Amer 85 (*)    All other components within normal limits  SODIUM, URINE, RANDOM  CREATININE, URINE, RANDOM  URINALYSIS, ROUTINE W REFLEX MICROSCOPIC   Imaging Review Dg Chest Port 1 View  02/18/2013   CLINICAL DATA:  Low sodium, weakness, confusion  EXAM: PORTABLE CHEST - 1 VIEW  COMPARISON:  02/02/2009  FINDINGS: Mild left-sided cardiac enlargement. Vascular pattern normal. Lungs clear.  IMPRESSION: No active disease.   Electronically Signed   By: Esperanza Heir M.D.   On: 02/18/2013 19:17    EKG Interpretation   None       MDM  No diagnosis found. Recurrent hypoNa, likely causes of  No known cause for recurrent low Na. With confusion/ neuro issues and low Na plan for admission.; CT head to rule out other cause.   Supple neck, no fever, holding on LP at this time with other likely cause.  Spoke with hospitalist, admitted tele, pt needs sitter.   The patients results and plan were reviewed and discussed.   Any x-rays performed were personally reviewed by myself.   Differential diagnosis were considered with the presenting HPI.  Diagnosis: Confusion, Hyponatremia, anemia CT no acute findings, reviewed results.   Admission/ observation were discussed with the admitting physician, patient and/or family and they are comfortable with the plan.     Enid Skeens, MD 02/20/13 2130
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
## 2013-02-19 ENCOUNTER — Inpatient Hospital Stay (HOSPITAL_COMMUNITY): Payer: Medicare Other

## 2013-02-19 DIAGNOSIS — E7800 Pure hypercholesterolemia, unspecified: Secondary | ICD-10-CM

## 2013-02-19 LAB — BASIC METABOLIC PANEL
CO2: 23 mEq/L (ref 19–32)
Calcium: 8.5 mg/dL (ref 8.4–10.5)
Creatinine, Ser: 0.78 mg/dL (ref 0.50–1.10)
GFR calc non Af Amer: 81 mL/min — ABNORMAL LOW (ref >90)
Glucose, Bld: 118 mg/dL — ABNORMAL HIGH (ref 70–99)
Sodium: 125 mEq/L — ABNORMAL LOW (ref 135–145)

## 2013-02-19 LAB — CREATININE, SERUM
Creatinine, Ser: 0.61 mg/dL (ref 0.50–1.10)
GFR calc Af Amer: >90 mL/min (ref >90)

## 2013-02-19 LAB — CBC
Hemoglobin: 11.4 g/dL — ABNORMAL LOW (ref 12.0–15.0)
MCH: 30.5 pg (ref 26.0–34.0)
MCV: 87.3 fL (ref 78.0–100.0)
MCV: 88 fL (ref 78.0–100.0)
Platelets: 303 10*3/uL (ref 150–400)
Platelets: 277 10*3/uL (ref 150–400)
RBC: 3.69 MIL/uL — ABNORMAL LOW (ref 3.87–5.11)
RDW: 12.4 % (ref 11.5–15.5)
RDW: 12.5 % (ref 11.5–15.5)
WBC: 6.2 10*3/uL (ref 4.0–10.5)
WBC: 6.7 10*3/uL (ref 4.0–10.5)

## 2013-02-19 LAB — VITAMIN B12: Vitamin B-12: >2000 pg/mL — ABNORMAL HIGH (ref 211–911)

## 2013-02-19 LAB — RPR: RPR Ser Ql: NONREACTIVE

## 2013-02-19 LAB — TROPONIN I: Troponin I: <0.3 ng/mL (ref <0.30)

## 2013-02-19 MED ORDER — QUETIAPINE 12.5 MG HALF TABLET
12.5000 mg | ORAL_TABLET | Freq: Two times a day (BID) | ORAL | Status: DC | PRN
  Filled 2013-02-19: qty 1

## 2013-02-19 MED ORDER — SODIUM CHLORIDE 0.9 % IV SOLN
INTRAVENOUS | Status: DC
  Administered 2013-02-20 – 2013-02-21 (×3): via INTRAVENOUS

## 2013-02-19 NOTE — Progress Notes (Signed)
[facility] PROGRESS NOTE  [facility] XBJ:[id number] DOB: 1940-06-09 DOA: 02/18/2013 PCP: Provider Not In System  Assessment/Plan: 1. Delirium. Patient likely having a history of cognitive impairment, presenting with psychotic symptoms. Neurology has been consulted, plan for MRI of brain and EEG. I would favor utilizing antipsychotic therapy rather than benzodiazepine. Start Seroquel twice a day as needed for psychotic symptoms. It appears she was recently admitted to the medicine service for increased confusion, altered mental status and agitation. 2. Hyponatremia. Initial lab work showing a sodium of 125. Looking back at her records, it appears she has a history of hyponatremia, with her sodium levels fluctuated between 126 and 130. I suspect that she may be dehydrated, which may have led to lower sodium levels from baseline. Family members that reported minimal by mouth intake over the last several days. 3. Hypothyroidism. Will check a TSH.  4. History of CVA. Patient patient on Aggrenox. Repeating MRI of brain today, neurology consulted, appreciate recommendations. 5. Hypertension. Continue lisinopril 20 mg by mouth daily  Code Status: Full code Family Communication: Plan discussed with patient's husband was present at bedside Disposition Plan: Continue supportive care   Consultants:  Neurology   HPI/Subjective: Patient with a past medical history of CVA, probable underlying cognitive impairment, recently discharged from our service on 02/13/2013 which time she presented with acute encephalopathy. She presented again with acute encephalopathy, family members reporting minimal by mouth intake, hallucinations, agitation, and an overall functional decline. Overnight she received benzodiazepine therapy, she is sedated, nonverbal however did follow a few simple commands for me.  Objective: Filed Vitals:   02/19/13 1040  BP: 100/44  Pulse: 51  Temp: 97.5 F (36.4 C)  Resp: 18     Intake/Output Summary (Last 24 hours) at 02/19/13 1348 Last data filed at 02/18/13 2101  Gross per 24 hour  Intake    500 ml  Output    500 ml  Net      0 ml   Filed Weights   02/18/13 2221  Weight: 46.131 kg (101 lb 11.2 oz)    Exam:   General:  Sedated, nonverbal, is following commands for me  Cardiovascular: Regular rate rhythm normal S1-S2  Respiratory: Clear to auscultation bilaterally  Abdomen: Soft nontender nondistended  Musculoskeletal: No edema  Neurological: Patient appears to be moving all extremities, following commands, though is nonverbal. I do not no facial droop no tongue deviation.    Data Reviewed: Basic Metabolic Panel:  Recent Labs Lab 02/12/13 1400 02/13/13 0455 02/18/13 1825 02/18/13 2205 02/19/13 0615  NA 128* 130* 125*  --  125*  K 4.5 4.0 4.6  --  3.6  CL 90* 98 89*  --  91*  CO2 27 23 25   --  23  GLUCOSE 135* 88 144*  --  118*  BUN 13 9 11   --  9  CREATININE 0.79 0.71 0.67 0.61 0.78  CALCIUM 9.9 7.9* 9.8  --  8.5   Liver Function Tests:  Recent Labs Lab 02/12/13 1400 02/18/13 1825  AST 53* 44*  ALT 42* 37*  ALKPHOS 59 62  BILITOT 0.2* 0.4  PROT 6.8 7.3  ALBUMIN 3.8 4.2   No results found for this basename: LIPASE, AMYLASE,  in the last 168 hours  Recent Labs Lab 02/12/13 1420  AMMONIA 20   CBC:  Recent Labs Lab 02/12/13 1400 02/13/13 0455 02/18/13 1825 02/18/13 2205 02/19/13 0615  WBC 4.8 3.6* 6.0 6.2 6.7  NEUTROABS 3.0  --  4.4  --   --  HGB 11.7* 10.3* 11.5* 11.4* 10.2*  HCT 32.6* 29.7* 32.4* 32.2* 29.4*  MCV 88.1 89.2 87.8 87.3 88.0  PLT 279 230 330 303 277   Cardiac Enzymes:  Recent Labs Lab 02/18/13 2207 02/19/13 0615 02/19/13 1056  TROPONINI <0.30 <0.30 <0.30   BNP (last 3 results) No results found for this basename: PROBNP,  in the last 8760 hours CBG: No results found for this basename: GLUCAP,  in the last 168 hours  No results found for this or any previous visit (from the past  240 hour(s)).   Studies: Ct Head Wo Contrast  02/18/2013   CLINICAL DATA:  Altered mental status.  Confusion.  EXAM: CT HEAD WITHOUT CONTRAST  TECHNIQUE: Contiguous axial images were obtained from the base of the skull through the vertex without intravenous contrast.  COMPARISON:  02/12/2013.  FINDINGS: No mass lesion, mass effect, midline shift, hydrocephalus, hemorrhage. No acute territorial cortical ischemia/infarct. Atrophy and chronic ischemic white matter disease is present.Paranasal sinuses appear within normal limits. Mastoid air cells are clear. There is low attenuation of the intravascular compartment with increased contrast between the dura and sagittal sinus suggesting anemia.  IMPRESSION: 1. Mild atrophy and chronic ischemic white matter disease without acute intracranial abnormality. No interval change. 2. Probable anemia.   Electronically Signed   By: Andreas Newport M.D.   On: 02/18/2013 20:28   Dg Pelvis Comp Min 3v  02/19/2013   CLINICAL DATA:  Status post fall; concern for pelvic injury.  EXAM: JUDET PELVIS - 3+ VIEW  COMPARISON:  None.  FINDINGS: There is no evidence of fracture or dislocation. Both femoral heads are seated normally within their respective acetabula. No significant degenerative change is appreciated. The sacroiliac joints are unremarkable in appearance. The sacrum and pubic rami appear intact.  The visualized bowel gas pattern is grossly unremarkable in appearance.  IMPRESSION: No evidence of fracture or dislocation.   Electronically Signed   By: Roanna Raider M.D.   On: 02/19/2013 01:11   Dg Chest Port 1 View  02/18/2013   CLINICAL DATA:  Low sodium, weakness, confusion  EXAM: PORTABLE CHEST - 1 VIEW  COMPARISON:  02/02/2009  FINDINGS: Mild left-sided cardiac enlargement. Vascular pattern normal. Lungs clear.  IMPRESSION: No active disease.   Electronically Signed   By: Esperanza Heir M.D.   On: 02/18/2013 19:17    Scheduled Meds: . aspirin EC  81 mg Oral  Daily  . dipyridamole-aspirin  1 capsule Oral BID  . docusate sodium  100 mg Oral BID  . enoxaparin (LOVENOX) injection  30 mg Subcutaneous Q24H  . levothyroxine  50 mcg Oral QAC breakfast  . lisinopril  20 mg Oral Daily  . sodium chloride  3 mL Intravenous Q12H   Continuous Infusions:   Principal Problem:   Acute encephalopathy Active Problems:   Hyponatremia   Thyroid disease   Hypertension   High cholesterol    Time spent: 35    Per [place]  [facility] Pager [phone]. If 7PM-7AM, please contact night-coverage at [email or web address], password [facility] 02/19/2013, 1:48 PM  LOS: 1 day

## 2013-02-19 NOTE — Progress Notes (Signed)
Pt is much more alert/oriented now than this morning.  Pt got oob and ambulated to her chair and bedside commode.  She responded appropriately to questions, and followed directions well.  Pt now in chair resting comfortably with her husband in the room.  No further c/o nausea or pain.  No further agitation.

## 2013-02-19 NOTE — Progress Notes (Signed)
When I arrived this AM, pt was sedated from medication given last night for severe agitation and combativeness.  Pt had a few small vomiting episodes while asleep on her bed linen.  Pt was given antiemetic and MD was notified.  Pt has had no vomiting episodes since it was administered.  Pt resting comfortably, but still sedated.  She moans when we try to wake her, but follows simple commands well.

## 2013-02-19 NOTE — Progress Notes (Signed)
Subjective: Patient and family in the room.  They feel patient is much better today. Is out of bed in char and eating at this time .    Objective: Current vital signs: BP 115/46  Pulse 48  Temp(Src) 99.2 F (37.3 C) (Oral)  Resp 19  Ht 5\' 2"  (1.575 m)  Wt 46.131 kg (101 lb 11.2 oz)  BMI 18.6 kg/m2  SpO2 98% Vital signs in last 24 hours: Temp:  [97.3 F (36.3 C)-99.2 F (37.3 C)] 99.2 F (37.3 C) (10/25 1413) Pulse Rate:  [48-90] 48 (10/25 1413) Resp:  [16-24] 19 (10/25 1413) BP: (79-190)/(44-74) 115/46 mmHg (10/25 1413) SpO2:  [96 %-100 %] 98 % (10/25 1413) Weight:  [46.131 kg (101 lb 11.2 oz)] 46.131 kg (101 lb 11.2 oz) (10/24 2221)  Intake/Output from previous day: 10/24 0701 - 10/25 0700 In: 500 [I.V.:500] Out: 500 [Urine:500] Intake/Output this shift:   Nutritional status: Cardiac  Neurologic Exam: Mental Status:  Alert, awake. Speech fluent without evidence of aphasia. Able to follow 3 step commands without difficulty.  Cranial Nerves:  II: Discs flat bilaterally; Visual fields grossly normal, pupils equal, round, reactive to light and accommodation  III,IV, VI: ptosis not present, extra-ocular motions intact bilaterally  V,VII: smile symmetric, facial light touch sensation normal bilaterally  VIII: hearing normal bilaterally  IX,X: gag reflex present  XI: bilateral shoulder shrug  XII: midline tongue extension without atrophy or fasciculations  Motor:  Lifts extremities symmetrically against gravity Deep Tendon Reflexes:  1+ throughout Plantars:  Right: downgoing   Left: downgoing   Lab Results: Basic Metabolic Panel:  Recent Labs Lab 02/13/13 0455 02/18/13 1825 02/18/13 2205 02/19/13 0615  NA 130* 125*  --  125*  K 4.0 4.6  --  3.6  CL 98 89*  --  91*  CO2 23 25  --  23  GLUCOSE 88 144*  --  118*  BUN 9 11  --  9  CREATININE 0.71 0.67 0.61 0.78  CALCIUM 7.9* 9.8  --  8.5    Liver Function Tests:  Recent Labs Lab 02/18/13 1825  AST 44*   ALT 37*  ALKPHOS 62  BILITOT 0.4  PROT 7.3  ALBUMIN 4.2   No results found for this basename: LIPASE, AMYLASE,  in the last 168 hours No results found for this basename: AMMONIA,  in the last 168 hours  CBC:  Recent Labs Lab 02/13/13 0455 02/18/13 1825 02/18/13 2205 02/19/13 0615  WBC 3.6* 6.0 6.2 6.7  NEUTROABS  --  4.4  --   --   HGB 10.3* 11.5* 11.4* 10.2*  HCT 29.7* 32.4* 32.2* 29.4*  MCV 89.2 87.8 87.3 88.0  PLT 230 330 303 277    Cardiac Enzymes:  Recent Labs Lab 02/18/13 2207 02/19/13 0615 02/19/13 1056  TROPONINI <0.30 <0.30 <0.30    Lipid Panel: No results found for this basename: CHOL, TRIG, HDL, CHOLHDL, VLDL, LDLCALC,  in the last 168 hours  CBG: No results found for this basename: GLUCAP,  in the last 168 hours  Microbiology: No results found for this or any previous visit.  Coagulation Studies: No results found for this basename: LABPROT, INR,  in the last 72 hours  Imaging: Ct Head Wo Contrast  02/18/2013   CLINICAL DATA:  Altered mental status.  Confusion.  EXAM: CT HEAD WITHOUT CONTRAST  TECHNIQUE: Contiguous axial images were obtained from the base of the skull through the vertex without intravenous contrast.  COMPARISON:  02/12/2013.  FINDINGS: No  mass lesion, mass effect, midline shift, hydrocephalus, hemorrhage. No acute territorial cortical ischemia/infarct. Atrophy and chronic ischemic white matter disease is present.Paranasal sinuses appear within normal limits. Mastoid air cells are clear. There is low attenuation of the intravascular compartment with increased contrast between the dura and sagittal sinus suggesting anemia.  IMPRESSION: 1. Mild atrophy and chronic ischemic white matter disease without acute intracranial abnormality. No interval change. 2. Probable anemia.   Electronically Signed   By: Andreas Newport M.D.   On: 02/18/2013 20:28   Mr Brain Wo Contrast  02/19/2013   CLINICAL DATA:  Altered mental status. Increased  confusion and agitation. Past history of metabolic encephalopathy.  EXAM: MRI HEAD WITHOUT CONTRAST  TECHNIQUE: Multiplanar, multisequence MR imaging was performed. No intravenous contrast was administered.  COMPARISON:  CT head without contrast 02/18/2013. MRI brain 02/01/2009.  FINDINGS: Moderate generalized atrophy and diffuse white matter disease is not significantly changed from prior exam. No acute infarct, hemorrhage, or mass lesion is present. A remote right cerebellar infarct is evident. Midline structures are within normal limits. Flow is present in the major intracranial arteries. The globes and orbits are intact. The paranasal sinuses and mastoid air cells are clear.  IMPRESSION: 1. Stable atrophy and diffuse white matter disease. This likely reflects the sequelae of chronic microvascular ischemia. 2. Remote lacunar infarcts of the basal ganglia and right cerebellum. 3. No acute intracranial abnormality.   Electronically Signed   By: Gennette Pac M.D.   On: 02/19/2013 13:55   Dg Pelvis Comp Min 3v  02/19/2013   CLINICAL DATA:  Status post fall; concern for pelvic injury.  EXAM: JUDET PELVIS - 3+ VIEW  COMPARISON:  None.  FINDINGS: There is no evidence of fracture or dislocation. Both femoral heads are seated normally within their respective acetabula. No significant degenerative change is appreciated. The sacroiliac joints are unremarkable in appearance. The sacrum and pubic rami appear intact.  The visualized bowel gas pattern is grossly unremarkable in appearance.  IMPRESSION: No evidence of fracture or dislocation.   Electronically Signed   By: Roanna Raider M.D.   On: 02/19/2013 01:11   Dg Chest Port 1 View  02/18/2013   CLINICAL DATA:  Low sodium, weakness, confusion  EXAM: PORTABLE CHEST - 1 VIEW  COMPARISON:  02/02/2009  FINDINGS: Mild left-sided cardiac enlargement. Vascular pattern normal. Lungs clear.  IMPRESSION: No active disease.   Electronically Signed   By: Esperanza Heir M.D.    On: 02/18/2013 19:17    Medications:  I have reviewed the patient's current medications. Scheduled: . aspirin EC  81 mg Oral Daily  . dipyridamole-aspirin  1 capsule Oral BID  . docusate sodium  100 mg Oral BID  . enoxaparin (LOVENOX) injection  30 mg Subcutaneous Q24H  . levothyroxine  50 mcg Oral QAC breakfast  . lisinopril  20 mg Oral Daily  . sodium chloride  3 mL Intravenous Q12H    Assessment/Plan: Patient much improved.  Sodium remains low.  MRI of the brain reviewed and shows no acute infarcts.  Some small chronic infarcts noted.  B12, TSH, RPR are all unremarkable.  EEG pending. Suspect underlying dementia with superimposed agitated delirium due to hyponatremia.  Recommendations: 1.  Agree with continued addressing of metabolic issues.      LOS: 1 day   Thana Farr, MD [facility] [phone] 02/19/2013  5:08 PM

## 2013-02-20 LAB — BASIC METABOLIC PANEL
CO2: 22 mEq/L (ref 19–32)
Calcium: 8.2 mg/dL — ABNORMAL LOW (ref 8.4–10.5)
Chloride: 97 mEq/L (ref 96–112)
Creatinine, Ser: 0.7 mg/dL (ref 0.50–1.10)
GFR calc Af Amer: >90 mL/min (ref >90)
Glucose, Bld: 93 mg/dL (ref 70–99)
Potassium: 3.8 mEq/L (ref 3.5–5.1)
Sodium: 128 mEq/L — ABNORMAL LOW (ref 135–145)

## 2013-02-20 NOTE — Progress Notes (Signed)
Pt seems improved this am.  Pt got up to chair, but is still weak and unsteady.  Pt had rambling speech this morning and became tearful expressing her fear of the physician sending her to a SNF.  I told her that is not part of the plan, and she calmed down.  Pt still has intermittent confusion, but seems to be close to baseline according to husband.

## 2013-02-20 NOTE — Progress Notes (Signed)
Pt has difficulty swallowing and eating acidic foods or foods not at a precise temperature.  She grimaces when she drinks, and her husband states this is contributing to her inadequate po intake of fluids at home.  She complains of consistent mouth/throat ulcers, and an ill-fitting partial denture.  MD was notified and will contact an ENT MD to evaluate the pt.

## 2013-02-20 NOTE — Progress Notes (Signed)
[facility] PROGRESS NOTE  Dorthea Maina [facility] ZOX:[id number] DOB: 12-08-1940 DOA: 02/18/2013 PCP: Provider Not In System  Assessment/Plan: 1. Delirium. Patient likely having a history of cognitive impairment, presenting with psychotic symptoms. MRI not showing acute changes. She is much improved today, suspect dehydration and hyponatremia precipitating delirium.  2. Hyponatremia. Improved with am sodium level of 128 from 125. Has history of hyponatremia, which could be getting worse with minimal po intake.  3. Oral Ulcers. Present around dentures, although she also complains of pain with swallowing. It's possible that this may be leading to dehydration as she's not eating as much. Will consult ENT 4. Hypothyroidism. TSH with in normal range  5. History of CVA. Patient patient on Aggrenox. 6. Hypertension. Continue lisinopril 20 mg by mouth daily  Code Status: Full code Family Communication: Plan discussed with patient's husband was present at bedside Disposition Plan: Will consult ENT   Consultants:  Neurology   HPI/Subjective: Patient is much improved today, appearing to be at baseline. She is tolerating PO intake, also complains of oral and throat pain with eating and drinking. I suspect they could be leading her to not eat and drink as much.    Objective: Filed Vitals:   02/20/13 1132  BP: 171/77  Pulse: 85  Temp: 98.8 F (37.1 C)  Resp: 18    Intake/Output Summary (Last 24 hours) at 02/20/13 1727 Last data filed at 02/20/13 0700  Gross per 24 hour  Intake 1363.75 ml  Output    425 ml  Net 938.75 ml   Filed Weights   02/18/13 2221 02/19/13 2019  Weight: 46.131 kg (101 lb 11.2 oz) 46.131 kg (101 lb 11.2 oz)    Exam:   General:  Sedated, nonverbal, is following commands for me  Cardiovascular: Regular rate rhythm normal S1-S2  Respiratory: Clear to auscultation bilaterally  Abdomen: Soft nontender nondistended  Musculoskeletal: No edema  Neurological:  Patient appears to be moving all extremities, following commands, though is nonverbal. I do not no facial droop no tongue deviation.    Data Reviewed: Basic Metabolic Panel:  Recent Labs Lab 02/18/13 1825 02/18/13 2205 02/19/13 0615 02/20/13 0800  NA 125*  --  125* 128*  K 4.6  --  3.6 3.8  CL 89*  --  91* 97  CO2 25  --  23 22  GLUCOSE 144*  --  118* 93  BUN 11  --  9 10  CREATININE 0.67 0.61 0.78 0.70  CALCIUM 9.8  --  8.5 8.2*   Liver Function Tests:  Recent Labs Lab 02/18/13 1825  AST 44*  ALT 37*  ALKPHOS 62  BILITOT 0.4  PROT 7.3  ALBUMIN 4.2   No results found for this basename: LIPASE, AMYLASE,  in the last 168 hours No results found for this basename: AMMONIA,  in the last 168 hours CBC:  Recent Labs Lab 02/18/13 1825 02/18/13 2205 02/19/13 0615  WBC 6.0 6.2 6.7  NEUTROABS 4.4  --   --   HGB 11.5* 11.4* 10.2*  HCT 32.4* 32.2* 29.4*  MCV 87.8 87.3 88.0  PLT 330 303 277   Cardiac Enzymes:  Recent Labs Lab 02/18/13 2207 02/19/13 0615 02/19/13 1056  TROPONINI <0.30 <0.30 <0.30   BNP (last 3 results) No results found for this basename: PROBNP,  in the last 8760 hours CBG: No results found for this basename: GLUCAP,  in the last 168 hours  No results found for this or any previous visit (from the past 240  hour(s)).   Studies: Ct Head Wo Contrast  02/18/2013   CLINICAL DATA:  Altered mental status.  Confusion.  EXAM: CT HEAD WITHOUT CONTRAST  TECHNIQUE: Contiguous axial images were obtained from the base of the skull through the vertex without intravenous contrast.  COMPARISON:  02/12/2013.  FINDINGS: No mass lesion, mass effect, midline shift, hydrocephalus, hemorrhage. No acute territorial cortical ischemia/infarct. Atrophy and chronic ischemic white matter disease is present.Paranasal sinuses appear within normal limits. Mastoid air cells are clear. There is low attenuation of the intravascular compartment with increased contrast between the  dura and sagittal sinus suggesting anemia.  IMPRESSION: 1. Mild atrophy and chronic ischemic white matter disease without acute intracranial abnormality. No interval change. 2. Probable anemia.   Electronically Signed   By: Andreas Newport M.D.   On: 02/18/2013 20:28   Mr Brain Wo Contrast  02/19/2013   CLINICAL DATA:  Altered mental status. Increased confusion and agitation. Past history of metabolic encephalopathy.  EXAM: MRI HEAD WITHOUT CONTRAST  TECHNIQUE: Multiplanar, multisequence MR imaging was performed. No intravenous contrast was administered.  COMPARISON:  CT head without contrast 02/18/2013. MRI brain 02/01/2009.  FINDINGS: Moderate generalized atrophy and diffuse white matter disease is not significantly changed from prior exam. No acute infarct, hemorrhage, or mass lesion is present. A remote right cerebellar infarct is evident. Midline structures are within normal limits. Flow is present in the major intracranial arteries. The globes and orbits are intact. The paranasal sinuses and mastoid air cells are clear.  IMPRESSION: 1. Stable atrophy and diffuse white matter disease. This likely reflects the sequelae of chronic microvascular ischemia. 2. Remote lacunar infarcts of the basal ganglia and right cerebellum. 3. No acute intracranial abnormality.   Electronically Signed   By: Gennette Pac M.D.   On: 02/19/2013 13:55   Dg Pelvis Comp Min 3v  02/19/2013   CLINICAL DATA:  Status post fall; concern for pelvic injury.  EXAM: JUDET PELVIS - 3+ VIEW  COMPARISON:  None.  FINDINGS: There is no evidence of fracture or dislocation. Both femoral heads are seated normally within their respective acetabula. No significant degenerative change is appreciated. The sacroiliac joints are unremarkable in appearance. The sacrum and pubic rami appear intact.  The visualized bowel gas pattern is grossly unremarkable in appearance.  IMPRESSION: No evidence of fracture or dislocation.   Electronically Signed   By:  Roanna Raider M.D.   On: 02/19/2013 01:11   Dg Chest Port 1 View  02/18/2013   CLINICAL DATA:  Low sodium, weakness, confusion  EXAM: PORTABLE CHEST - 1 VIEW  COMPARISON:  02/02/2009  FINDINGS: Mild left-sided cardiac enlargement. Vascular pattern normal. Lungs clear.  IMPRESSION: No active disease.   Electronically Signed   By: Esperanza Heir M.D.   On: 02/18/2013 19:17    Scheduled Meds: . aspirin EC  81 mg Oral Daily  . dipyridamole-aspirin  1 capsule Oral BID  . docusate sodium  100 mg Oral BID  . enoxaparin (LOVENOX) injection  30 mg Subcutaneous Q24H  . levothyroxine  50 mcg Oral QAC breakfast  . lisinopril  20 mg Oral Daily  . sodium chloride  3 mL Intravenous Q12H   Continuous Infusions: . sodium chloride 75 mL/hr at 02/20/13 0630    Principal Problem:   Acute encephalopathy Active Problems:   Hyponatremia   Thyroid disease   Hypertension   High cholesterol    Time spent: 35    Jeralyn Bennett  [facility] Pager [phone]. If 7PM-7AM, please  contact night-coverage at [email or web address], password [facility] 02/20/2013, 5:27 PM  LOS: 2 days

## 2013-02-21 ENCOUNTER — Inpatient Hospital Stay (HOSPITAL_COMMUNITY): Payer: Medicare Other

## 2013-02-21 LAB — BASIC METABOLIC PANEL
BUN: 8 mg/dL (ref 6–23)
Calcium: 8.2 mg/dL — ABNORMAL LOW (ref 8.4–10.5)
GFR calc non Af Amer: 85 mL/min — ABNORMAL LOW (ref >90)
Glucose, Bld: 196 mg/dL — ABNORMAL HIGH (ref 70–99)
Sodium: 129 mEq/L — ABNORMAL LOW (ref 135–145)

## 2013-02-21 MED ORDER — GATORADE (BH)
240.0000 mL | Freq: Two times a day (BID) | ORAL | Status: DC
  Administered 2013-02-21 – 2013-02-28 (×14): 240 mL via ORAL
  Filled 2013-02-21: qty 480

## 2013-02-21 MED ORDER — QUETIAPINE 12.5 MG HALF TABLET
12.5000 mg | ORAL_TABLET | Freq: Every day | ORAL | Status: DC
  Filled 2013-02-21: qty 1

## 2013-02-21 MED ORDER — QUETIAPINE FUMARATE 25 MG PO TABS
25.0000 mg | ORAL_TABLET | Freq: Two times a day (BID) | ORAL | Status: DC
  Administered 2013-02-21: 25 mg via ORAL
  Filled 2013-02-21 (×3): qty 1

## 2013-02-21 MED ORDER — LACTULOSE 10 GM/15ML PO SOLN
20.0000 g | Freq: Two times a day (BID) | ORAL | Status: DC | PRN
  Administered 2013-02-21 – 2013-02-23 (×3): 20 g via ORAL
  Filled 2013-02-21 (×4): qty 30

## 2013-02-21 MED ORDER — SALINE SPRAY 0.65 % NA SOLN
2.0000 | Freq: Three times a day (TID) | NASAL | Status: DC
  Administered 2013-02-21 – 2013-02-28 (×20): 2 via NASAL
  Filled 2013-02-21 (×2): qty 44

## 2013-02-21 NOTE — Progress Notes (Signed)
[facility] PROGRESS NOTE  Nichole Foster [facility] WUJ:[id number] DOB: 1940-09-25 DOA: 02/18/2013 PCP: Provider Not In System  Assessment/Plan: 1. Delirium. Despite upward trend in sodium levels, she appears to have more agitation, pressured speech, despite improving sodium levels. Will continue NS, consult psychiatry.   2. Hyponatremia. Sodium improving to 129 on this morning's labs.  3. Oral Ulcers. ENT consulted, appreciate recommendations.  4. Hypothyroidism. TSH with in normal range  5. History of CVA. Patient patient on Aggrenox. 6. Hypertension. Continue lisinopril 20 mg by mouth daily  Code Status: Full code Family Communication: Plan discussed with patient's husband was present at bedside Disposition Plan: Will consult ENT   Consultants:  Neurology   HPI/Subjective: Today she appears to have increased agitation, pressured speech, hypermanic, husband at bedside stating this is not her baseline. This change has occurred despite the upward trend in sodium levels to 129 on today's lab work. MRI showed no CVA.   Objective: Filed Vitals:   02/21/13 1445  BP: 161/73  Pulse: 82  Temp: 98.6 F (37 C)  Resp: 18    Intake/Output Summary (Last 24 hours) at 02/21/13 1608 Last data filed at 02/21/13 0900  Gross per 24 hour  Intake   1230 ml  Output    601 ml  Net    629 ml   Filed Weights   02/18/13 2221 02/19/13 2019 02/20/13 2141  Weight: 46.131 kg (101 lb 11.2 oz) 46.131 kg (101 lb 11.2 oz) 48.6 kg (107 lb 2.3 oz)    Exam:   General:  Hyperactive, pressured speech, agitated.   Cardiovascular: Regular rate rhythm normal S1-S2  Respiratory: Clear to auscultation bilaterally  Abdomen: Soft nontender nondistended  Musculoskeletal: No edema  Neurological: She continues to have a nonfocal neurologic exam. 5/5 muscle strength to all extremities.    Data Reviewed: Basic Metabolic Panel:  Recent Labs Lab 02/18/13 1825 02/18/13 2205 02/19/13 0615 02/20/13 0800  02/21/13 0944  NA 125*  --  125* 128* 129*  K 4.6  --  3.6 3.8 4.0  CL 89*  --  91* 97 97  CO2 25  --  23 22 24   GLUCOSE 144*  --  118* 93 196*  BUN 11  --  9 10 8   CREATININE 0.67 0.61 0.78 0.70 0.68  CALCIUM 9.8  --  8.5 8.2* 8.2*   Liver Function Tests:  Recent Labs Lab 02/18/13 1825  AST 44*  ALT 37*  ALKPHOS 62  BILITOT 0.4  PROT 7.3  ALBUMIN 4.2   No results found for this basename: LIPASE, AMYLASE,  in the last 168 hours No results found for this basename: AMMONIA,  in the last 168 hours CBC:  Recent Labs Lab 02/18/13 1825 02/18/13 2205 02/19/13 0615  WBC 6.0 6.2 6.7  NEUTROABS 4.4  --   --   HGB 11.5* 11.4* 10.2*  HCT 32.4* 32.2* 29.4*  MCV 87.8 87.3 88.0  PLT 330 303 277   Cardiac Enzymes:  Recent Labs Lab 02/18/13 2207 02/19/13 0615 02/19/13 1056  TROPONINI <0.30 <0.30 <0.30   BNP (last 3 results) No results found for this basename: PROBNP,  in the last 8760 hours CBG: No results found for this basename: GLUCAP,  in the last 168 hours  No results found for this or any previous visit (from the past 240 hour(s)).   Studies: No results found.  Scheduled Meds: . aspirin EC  81 mg Oral Daily  . dipyridamole-aspirin  1 capsule Oral BID  . docusate  sodium  100 mg Oral BID  . enoxaparin (LOVENOX) injection  30 mg Subcutaneous Q24H  . levothyroxine  50 mcg Oral QAC breakfast  . lisinopril  20 mg Oral Daily  . sodium chloride  2 spray Each Nare TID  . sodium chloride  3 mL Intravenous Q12H   Continuous Infusions: . sodium chloride 75 mL/hr at 02/21/13 1341    Principal Problem:   Acute encephalopathy Active Problems:   Hyponatremia   Thyroid disease   Hypertension   High cholesterol    Time spent: 35    Calloway Andrus  [facility] Pager [phone]. If 7PM-7AM, please contact night-coverage at [email or web address], password [facility] 02/21/2013, 4:08 PM  LOS: 3 days

## 2013-02-21 NOTE — Progress Notes (Signed)
Routine EEG completed.  

## 2013-02-21 NOTE — Consult Note (Signed)
[place], [place] [id number] 17-Sep-1940 Jeralyn Bennett, MD  Reason for Consult: odynophagia  HPI:72yo admitted for hyponatremia, delirium. Has been complaining of sore throat and pain when swallowing so ENT consulted for scope.  Allergies:  Allergies  Allergen Reactions  . Latex Rash    "to privates"    ROS: delirium/confusion, sore throat, odynophagia-otherwise negative x 10 systems except per HPI.  PMH:  Past Medical History  Diagnosis Date  . Hypertension   . High cholesterol   . Stroke   . Heart murmur   . Osteoporosis   . Panic attack   . Thyroid disease     FH: No family history on file.  SH:  History   Social History  . Marital Status: Married    Spouse Name: N/A    Number of Children: N/A  . Years of Education: N/A   Occupational History  . Not on file.   Social History Main Topics  . Smoking status: Never Smoker   . Smokeless tobacco: Never Used  . Alcohol Use: No  . Drug Use: No  . Sexual Activity: Not on file   Other Topics Concern  . Not on file   Social History Narrative  . No narrative on file    PSH:  Past Surgical History  Procedure Laterality Date  . Cataract extraction      Physical  Exam: CN 2-12 grossly intact and symmetric. EAC/TMs normal BL. Oral cavity ororpharynx normal with no masses. Skin warm and dry. Nasal cavity with rhinitis/physiologic mucous and crusting L>R. External nose and ears without masses or lesions. EOMI, PERRLA. Neck supple with no masses or lesions. Trachea midline.  Procedure Note: 31575 Informed verbal consent was obtained after explaining the risks (including bleeding and infection), benefits and alternatives of the procedure. Verbal timeout was performed prior to the procedure. The nose was topicalized with topical oxymetazoline spray. The 4mm flexible scope was advanced through the nasal cavity bilaterally. The septum and turbinates appeared grossly normal but there is thick mucous in the nasal cavity L>R  consistent with rhinitis. The middle meatus was free of polyps or purulence. The eustachian tube, choana, and adenoids were normal in appearance. The hypopharynx, arytenoids, false vocal folds, and true vocal folds appeared normal with normal adduction and abduction. The left lateral tongue base/vallecula shows two small 0.25 to 0.5cm smooth walled, pedunculated, benign appearing masses consistent with small vallecular cysts. The visualized portion of the subglottis appeared normal. The patient tolerated the procedure with no immediate complications.  A/P: odynophagia with mild/moderate rhinitis and two small left lateral vallecular cysts. The vallecular cysts may be accounting for some of her odynophagia. She can see any of the MDs at [facility] as an outpatient to be set up for outpatient direct laryngoscopy and removal of the vallecular cysts once she is discharged. Otherwise can treat her rhinitis and odynophagia symptomatically with nasal saline spray/irrigations and/or lidocaine gargles or magic mouthwash. Primary team can consider barium swallow/GI consult if needs further swallowing evaluation.   Melvenia Beam 02/21/2013 7:38 AM

## 2013-02-21 NOTE — Procedures (Signed)
ELECTROENCEPHALOGRAM REPORT   Patient: Nichole Foster [facility]       Room #: 1O10 EEG No. ID: [id number] Age: 72 y.o.        Sex: female Referring Physician: Vanessa Barbara Report Date:  02/21/2013        Interpreting Physician: Thana Farr D  History: [name] is an 72 y.o. female with an episode of confusion  Medications:  Scheduled: . aspirin EC  81 mg Oral Daily  . dipyridamole-aspirin  1 capsule Oral BID  . docusate sodium  100 mg Oral BID  . enoxaparin (LOVENOX) injection  30 mg Subcutaneous Q24H  . levothyroxine  50 mcg Oral QAC breakfast  . lisinopril  20 mg Oral Daily  . sodium chloride  2 spray Each Nare TID  . sodium chloride  3 mL Intravenous Q12H    Conditions of Recording:  This is a 16 channel EEG carried out with the patient in the awake, drowsy and asleep states.  Description:  The waking background activity consists of a low voltage, symmetrical, fairly well organized, 10 Hz alpha activity, seen from the parieto-occipital and posterior temporal regions.  Low voltage fast activity, poorly organized, is seen anteriorly and is at times superimposed on more posterior regions.  A mixture of theta and alpha rhythms are seen from the central and temporal regions. The patient drowses with slowing to irregular, low voltage theta and beta activity.   The patient goes in to a light sleep with symmetrical sleep spindles, vertex central sharp transients and irregular slow activity.  Hyperventilation and intermittent photic stimulation were not performed.  IMPRESSION: This is a normal electrencephalogram   Thana Farr, MD [facility] [phone] 02/21/2013, 3:20 PM

## 2013-02-22 DIAGNOSIS — A419 Sepsis, unspecified organism: Secondary | ICD-10-CM

## 2013-02-22 DIAGNOSIS — R404 Transient alteration of awareness: Secondary | ICD-10-CM

## 2013-02-22 DIAGNOSIS — F411 Generalized anxiety disorder: Secondary | ICD-10-CM

## 2013-02-22 LAB — BASIC METABOLIC PANEL
BUN: 7 mg/dL (ref 6–23)
Creatinine, Ser: 0.62 mg/dL (ref 0.50–1.10)
GFR calc Af Amer: >90 mL/min (ref >90)
GFR calc non Af Amer: 87 mL/min — ABNORMAL LOW (ref >90)
Potassium: 3.8 mEq/L (ref 3.5–5.1)
Sodium: 133 mEq/L — ABNORMAL LOW (ref 135–145)

## 2013-02-22 LAB — CBC
Hemoglobin: 11.4 g/dL — ABNORMAL LOW (ref 12.0–15.0)
MCHC: 34.2 g/dL (ref 30.0–36.0)
Platelets: 303 10*3/uL (ref 150–400)
RDW: 13.1 % (ref 11.5–15.5)

## 2013-02-22 MED ORDER — RISPERIDONE 0.5 MG PO TBDP
0.5000 mg | ORAL_TABLET | Freq: Two times a day (BID) | ORAL | Status: DC
  Administered 2013-02-22 – 2013-02-28 (×12): 0.5 mg via ORAL
  Filled 2013-02-22 (×15): qty 1

## 2013-02-22 MED ORDER — FLUOXETINE HCL 10 MG PO CAPS
10.0000 mg | ORAL_CAPSULE | Freq: Every day | ORAL | Status: DC
  Administered 2013-02-22 – 2013-02-28 (×7): 10 mg via ORAL
  Filled 2013-02-22 (×7): qty 1

## 2013-02-22 NOTE — Consult Note (Signed)
Reason for Consult: psychosis and cognitive impairment Referring Physician: Jeralyn Bennett, MD   Beryle Lathe is an 72 y.o. female.  HPI: Patient is seen and chart reviewed. Case discussed with her Dr. Vanessa Barbara and staff RN regarding possible psychiatric treatment recommendation for this present 72 year old female with a history of cognitive impairment, hyponatremia. Patient has not responded with a small dose of Seroquel given last night. Her behavior is getting worse with the obsessive thoughts and compulsive behaviors and possible responding to internal stimuli. Patient hyponatremia was improved department her status did not change at this time . She presented to the emergent apartment on 02/18/2013 with increased confusion, agitation, and psychosis. Neurology was consulted as if she had an MRI of brain performed on 02/19/2013 which showed no acute intracranial abnormality. EEG was also performed on 02/21/2013 which was interpreted as normal by neurology. Patient has become increasingly hyperverbal, having pressured speech, racing thoughts, agitated, psychotic features, with ongoing confusion. Patient has no family at the bedside and it is difficult to understand her basic functional level.  MSE: Patient was appeared laying on her back with the head end elevated and constantly chanting numbers [id number] repeatedly and also using frequent word like "only" at the end of the response, which seems to be obsessed and also has a compulsive behaviors like touching her face in 4 different places repeatedly. Patient has significant cognitive impairment unable to respond appropriately to basic questions about orientation, concentration and memory.   Past Medical History  Diagnosis Date  . Hypertension   . High cholesterol   . Stroke   . Heart murmur   . Osteoporosis   . Panic attack   . Thyroid disease     Past Surgical History  Procedure Laterality Date  . Cataract extraction      No family history on  file.  Social History:  reports that she has never smoked. She has never used smokeless tobacco. She reports that she does not drink alcohol or use illicit drugs.  Allergies:  Allergies  Allergen Reactions  . Latex Rash    "to privates"    Medications: I have reviewed the patient's current medications.  Results for orders placed during the hospital encounter of 02/18/13 (from the past 48 hour(s))  BASIC METABOLIC PANEL     Status: Abnormal   Collection Time    02/21/13  9:44 AM      Result Value Range   Sodium 129 (*) 135 - 145 mEq/L   Potassium 4.0  3.5 - 5.1 mEq/L   Chloride 97  96 - 112 mEq/L   CO2 24  19 - 32 mEq/L   Glucose, Bld 196 (*) 70 - 99 mg/dL   BUN 8  6 - 23 mg/dL   Creatinine, Ser 1.61  0.50 - 1.10 mg/dL   Calcium 8.2 (*) 8.4 - 10.5 mg/dL   GFR calc non Af Amer 85 (*) >90 mL/min   GFR calc Af Amer >90  >90 mL/min   Comment: (NOTE)     The eGFR has been calculated using the CKD EPI equation.     This calculation has not been validated in all clinical situations.     eGFR's persistently <90 mL/min signify possible Chronic Kidney     Disease.  BASIC METABOLIC PANEL     Status: Abnormal   Collection Time    02/22/13  4:20 AM      Result Value Range   Sodium 133 (*) 135 - 145 mEq/L  Potassium 3.8  3.5 - 5.1 mEq/L   Chloride 100  96 - 112 mEq/L   CO2 22  19 - 32 mEq/L   Glucose, Bld 99  70 - 99 mg/dL   BUN 7  6 - 23 mg/dL   Creatinine, Ser 2.95  0.50 - 1.10 mg/dL   Calcium 8.4  8.4 - 62.1 mg/dL   GFR calc non Af Amer 87 (*) >90 mL/min   GFR calc Af Amer >90  >90 mL/min   Comment: (NOTE)     The eGFR has been calculated using the CKD EPI equation.     This calculation has not been validated in all clinical situations.     eGFR's persistently <90 mL/min signify possible Chronic Kidney     Disease.  CBC     Status: Abnormal   Collection Time    02/22/13  4:20 AM      Result Value Range   WBC 6.7  4.0 - 10.5 K/uL   RBC 3.72 (*) 3.87 - 5.11 MIL/uL    Hemoglobin 11.4 (*) 12.0 - 15.0 g/dL   HCT 30.8 (*) 65.7 - 84.6 %   MCV 89.5  78.0 - 100.0 fL   MCH 30.6  26.0 - 34.0 pg   MCHC 34.2  30.0 - 36.0 g/dL   RDW 96.2  95.2 - 84.1 %   Platelets 303  150 - 400 K/uL    No results found.  Positive for anxiety, learning difficulty, obsessive compulsive disorder and sleep disturbance, cognitive deficits. Blood pressure 169/93, pulse 98, temperature 97.4 F (36.3 C), temperature source Oral, resp. rate 20, height 5\' 2"  (1.575 m), weight 48.079 kg (105 lb 15.9 oz), SpO2 98.00%.   Assessment/Plan: Delirium with psychosis Anxiety disorder not other specified  Recommendation: Case discussed with the Dr. Vanessa Barbara Start fluoxetine 10 mg daily for anxiety and OCD symptoms Start risperidone disintegrating tablets 0.5 mg twice daily for agitation  Dementia and neurologic workup is negative  Patient will be referred to the geriatric psych hospitalization for further stabilization Please contact psychiatric social service at appropriate bed available in local area  Appreciate psychiatric consultation and followup as needed  Orlie Cundari,JANARDHAHA R. 02/22/2013, 1:30 PM

## 2013-02-22 NOTE — Progress Notes (Signed)
[facility] PROGRESS NOTE  Darielle Hancher [facility] ZOX:[id number] DOB: Nov 19, 1940 DOA: 02/18/2013 PCP: Provider Not In System   Interim summary Patient is a pleasant 72 year old female with a history of cognitive impairment, hyponatremia, who was recently discharged from our service on 02/13/2013 at which time she presented with acute encephalopathy. During that hospitalization she presented with a sodium of 128, improving to 130 after gentle IV fluid hydration. Mental status changes had improved and she was discharged in stable condition.  She presented to the emergent apartment on 02/18/2013 with increased confusion, agitation, and psychosis. Initial lab work showed a sodium of 125. She was started on IV fluids with normal saline. Neurology was consulted as if she had an MRI of brain performed on 02/19/2013 which showed no acute intracranial abnormality. EEG was also performed on 02/21/2013 which was interpreted as normal by neurology. Despite improvement to her sodium levels with sodium improving to 133 on 02/22/2013 from 125 on admission, she has not improved in mental status, and has become increasingly  hypervigilant, having pressured words, racing thoughts, agitated, psychotic features, with ongoing confusion. On 10/27 I gave her a dose of Seroquel 25 mg, which did not improve symptoms. Psychiatry has been consulted. Other issues addressed during this hospitalization include patient reporting pain on swallowing. I consulted Dr. Emeline Darling of ENT who performed laryngoscopy on 02/21/2013. He felt that vallecular cysts could be accounting for some of her odynophagia. After this it seemed that symptoms improved.                                                                                                                Assessment/Plan: 1. Delirium. Despite upward trend in sodium levels, she appears to have more agitation, pressured speech, despite improving sodium levels. Pending psychiatry consult. I  administered 25 mg of Seroquel overnight which did not help. I will discontinue Seroquel for now and await psychiatry input   2. Hyponatremia. Improving to 133 on a.m. lab work. She remains on normal saline at 75 mL per hour  3. Oral Ulcers. ENT consulted, appreciate recommendations.  4. Hypothyroidism. TSH with in normal range  5. History of CVA. Patient patient on Aggrenox. 6. Hypertension. Continue lisinopril 20 mg by mouth daily  Code Status: Full code Family Communication: Plan discussed with patient's husband was present at bedside Disposition Plan: Pending psychiatry consult   Consultants:  Neurology   HPI/Subjective: She continues to manifest agitation, pressured speech, hypervigilance, husband at bedside stating this is not her baseline. This change has occurred despite the upward trend in sodium levels to 129 on today's lab work. MRI showed no CVA.   Objective: Filed Vitals:   02/22/13 1025  BP: 169/93  Pulse: 98  Temp: 97.4 F (36.3 C)  Resp: 20    Intake/Output Summary (Last 24 hours) at 02/22/13 1118 Last data filed at 02/22/13 1025  Gross per 24 hour  Intake    360 ml  Output    500 ml  Net   -140 ml   [employer]  02/19/13 2019 02/20/13 2141 02/21/13 2048  Weight: 46.131 kg (101 lb 11.2 oz) 48.6 kg (107 lb 2.3 oz) 48.079 kg (105 lb 15.9 oz)    Exam:   General:  Hyperactive, pressured speech, agitated.   Cardiovascular: Regular rate rhythm normal S1-S2  Respiratory: Clear to auscultation bilaterally  Abdomen: Soft nontender nondistended  Musculoskeletal: No edema  Neurological: She continues to have a nonfocal neurologic exam. 5/5 muscle strength to all extremities.    Data Reviewed: Basic Metabolic Panel:  Recent Labs Lab 02/18/13 1825 02/18/13 2205 02/19/13 0615 02/20/13 0800 02/21/13 0944 02/22/13 0420  NA 125*  --  125* 128* 129* 133*  K 4.6  --  3.6 3.8 4.0 3.8  CL 89*  --  91* 97 97 100  CO2 25  --  23 22 24 22   GLUCOSE  144*  --  118* 93 196* 99  BUN 11  --  9 10 8 7   CREATININE 0.67 0.61 0.78 0.70 0.68 0.62  CALCIUM 9.8  --  8.5 8.2* 8.2* 8.4   Liver Function Tests:  Recent Labs Lab 02/18/13 1825  AST 44*  ALT 37*  ALKPHOS 62  BILITOT 0.4  PROT 7.3  ALBUMIN 4.2   No results found for this basename: LIPASE, AMYLASE,  in the last 168 hours No results found for this basename: AMMONIA,  in the last 168 hours CBC:  Recent Labs Lab 02/18/13 1825 02/18/13 2205 02/19/13 0615 02/22/13 0420  WBC 6.0 6.2 6.7 6.7  NEUTROABS 4.4  --   --   --   HGB 11.5* 11.4* 10.2* 11.4*  HCT 32.4* 32.2* 29.4* 33.3*  MCV 87.8 87.3 88.0 89.5  PLT 330 303 277 303   Cardiac Enzymes:  Recent Labs Lab 02/18/13 2207 02/19/13 0615 02/19/13 1056  TROPONINI <0.30 <0.30 <0.30   BNP (last 3 results) No results found for this basename: PROBNP,  in the last 8760 hours CBG: No results found for this basename: GLUCAP,  in the last 168 hours  No results found for this or any previous visit (from the past 240 hour(s)).   Studies: No results found.  Scheduled Meds: . aspirin EC  81 mg Oral Daily  . dipyridamole-aspirin  1 capsule Oral BID  . docusate sodium  100 mg Oral BID  . enoxaparin (LOVENOX) injection  30 mg Subcutaneous Q24H  . gatorade (BH)  240 mL Oral BID  . levothyroxine  50 mcg Oral QAC breakfast  . lisinopril  20 mg Oral Daily  . sodium chloride  2 spray Each Nare TID  . sodium chloride  3 mL Intravenous Q12H   Continuous Infusions: . sodium chloride 75 mL/hr at 02/21/13 1341    Principal Problem:   Acute encephalopathy Active Problems:   Hyponatremia   Thyroid disease   Hypertension   High cholesterol    Time spent: 35    Marquel Spoto  [facility] Pager [phone]. If 7PM-7AM, please contact night-coverage at [email or web address], password [facility] 02/22/2013, 11:18 AM  LOS: 4 days

## 2013-02-22 NOTE — Progress Notes (Addendum)
NEURO HOSPITALIST PROGRESS NOTE   SUBJECTIVE:                                                                                                                        Patient is in room, talking to the wall, making a repeating motion of her hand, knocking on the table three times the flipping her hand over and stating "take it back, take it back".  When asked what this means she states " I am warding off the satanic people", then puts her finger to her lips and makes a hushing motion, then looking to the left, as though there is someone there. When asked if she can hear the satanic people in the room--she states "yes".   OBJECTIVE:                                                                                                                           Vital signs in last 24 hours: Temp:  [97.4 F (36.3 C)-99 F (37.2 C)] 97.4 F (36.3 C) (10/28 1025) Pulse Rate:  [66-98] 98 (10/28 1025) Resp:  [16-20] 20 (10/28 1025) BP: (140-169)/(73-93) 169/93 mmHg (10/28 1025) SpO2:  [95 %-98 %] 98 % (10/28 1025) Weight:  [48.079 kg (105 lb 15.9 oz)] 48.079 kg (105 lb 15.9 oz) (10/27 2048)  Intake/Output from previous day: 10/27 0701 - 10/28 0700 In: 480 [P.O.:480] Out: 300 [Urine:300] Intake/Output this shift: Total I/O In: 120 [P.O.:120] Out: 200 [Urine:200] Nutritional status: Cardiac  Past Medical History  Diagnosis Date  . Hypertension   . High cholesterol   . Stroke   . Heart murmur   . Osteoporosis   . Panic attack   . Thyroid disease      Neurologic Exam:  Mental Status: Alert, not able to tell me place, date or year. Making motions as noted above.  Speech fluent without evidence of aphasia.  Able to follow 3 step commands intermittently when she is not nervous about the people she is seeing in the room.  Cranial Nerves: II: Visual fields grossly normal, pupils equal, round, reactive to light and accommodation III,IV, VI: ptosis not  present, extra-ocular motions intact bilaterally V,VII: smile symmetric, facial light touch sensation normal bilaterally VIII: hearing normal  bilaterally IX,X: gag reflex present XI: bilateral shoulder shrug XII: midline tongue extension without atrophy or fasciculations  Motor: Moving all extremities antigravity Tone and bulk:normal tone throughout; no atrophy noted Sensory: Pinprick and light touch intact throughout, bilaterally Deep Tendon Reflexes:  1+ throughout Plantars: Right: downgoing   Left: downgoing    Lab Results: No results found for this basename: cbc, bmp, coags, chol, tri, ldl, hga1c   Lipid Panel No results found for this basename: CHOL, TRIG, HDL, CHOLHDL, VLDL, LDLCALC,  in the last 72 hours  Studies/Results: No results found.  MEDICATIONS                                                                                                                        Scheduled: . aspirin EC  81 mg Oral Daily  . dipyridamole-aspirin  1 capsule Oral BID  . docusate sodium  100 mg Oral BID  . enoxaparin (LOVENOX) injection  30 mg Subcutaneous Q24H  . gatorade (BH)  240 mL Oral BID  . levothyroxine  50 mcg Oral QAC breakfast  . lisinopril  20 mg Oral Daily  . sodium chloride  2 spray Each Nare TID  . sodium chloride  3 mL Intravenous Q12H    ASSESSMENT/PLAN:                                                                                                            Patient currently with auditory and visual hallucinations.  In addition, she is going through a ritual like motion to "turn the clock back". Sodium improved to 133. MRI of the brain reviewed and shows no acute infarcts. Some small chronic infarcts noted. B12, TSH, RPR are all unremarkable.  EEG is normal showing no seizure activity. Suspect underlying dementia/psychosis with superimposed agitated delirium due to hyponatremia.  Sodium improving.  Brain may lag behind lab work.    Recommendations: 1.  No  further neurologic intervention is recommended at this time.  If further questions arise, please call or page at that time.  Thank you for allowing neurology to participate in the care of this patient. 2.  Agree with psychiatry evaluation   Felicie Morn PA-C [facility] [phone]  02/22/2013, 11:06 AM

## 2013-02-23 LAB — CBC
HCT: 29.2 % — ABNORMAL LOW (ref 36.0–46.0)
Hemoglobin: 10.3 g/dL — ABNORMAL LOW (ref 12.0–15.0)
MCHC: 35.3 g/dL (ref 30.0–36.0)
RBC: 3.32 MIL/uL — ABNORMAL LOW (ref 3.87–5.11)
WBC: 6.1 10*3/uL (ref 4.0–10.5)

## 2013-02-23 LAB — BASIC METABOLIC PANEL
CO2: 22 mEq/L (ref 19–32)
Calcium: 8.8 mg/dL (ref 8.4–10.5)
GFR calc non Af Amer: 90 mL/min — ABNORMAL LOW (ref >90)
Glucose, Bld: 110 mg/dL — ABNORMAL HIGH (ref 70–99)
Potassium: 3.5 mEq/L (ref 3.5–5.1)
Sodium: 132 mEq/L — ABNORMAL LOW (ref 135–145)

## 2013-02-23 MED ORDER — HYDROCORTISONE 2.5 % RE CREA
TOPICAL_CREAM | Freq: Four times a day (QID) | RECTAL | Status: DC
  Administered 2013-02-23 – 2013-02-26 (×5): via RECTAL
  Administered 2013-02-26: 1 via RECTAL
  Administered 2013-02-27 (×4): via RECTAL
  Filled 2013-02-23 (×3): qty 28.35

## 2013-02-23 MED ORDER — HALOPERIDOL LACTATE 5 MG/ML IJ SOLN: 2.0000 mg | Freq: Once | INTRAMUSCULAR | Status: DC

## 2013-02-23 MED ORDER — BIOTENE DRY MOUTH MT LIQD
15.0000 mL | Freq: Two times a day (BID) | OROMUCOSAL | Status: DC
  Administered 2013-02-23 – 2013-02-28 (×10): 15 mL via OROMUCOSAL
  Filled 2013-02-23: qty 15

## 2013-02-23 NOTE — Consult Note (Signed)
Reason for Consult: psychosis and cognitive impairment Referring Physician: Jeralyn Bennett, MD   Nichole Foster is an 72 y.o. female.  HPI: This is a psychiatric consultation follow up. Patient has been anxious and confused. She has less talkative and distressed today. She has a history of cognitive impairment, and hyponatremia. Her behavior is getting worse with the obsessive thoughts, compulsive behaviors and responding to internal stimuli. She has taken medication risperidone and prozac and tolerating without side effects like EPS or increased anxiety. Patient was admitted on 02/18/2013 with increased confusion, agitation, and psychosis. Patient has become increasingly hyperverbal, having pressured speech, racing thoughts, agitated, psychotic features, with ongoing confusion.   Neurology was consulted as if she had an MRI of brain performed on 02/19/2013 which showed no acute intracranial abnormality. EEG was also performed on 02/21/2013 which was interpreted as normal by neurology.   MSE: Patient was appeared sitting in a chair next to her bed and eating her supper without much distress. She has responded to verbal queries but seems like difficult to answer appropriately. She seems to be obsessed and has a compulsive behaviors. Patient has significant cognitive impairment unable to respond appropriately to basic questions about orientation, concentration and memory.   Past Medical History  Diagnosis Date  . Hypertension   . High cholesterol   . Stroke   . Heart murmur   . Osteoporosis   . Panic attack   . Thyroid disease     Past Surgical History  Procedure Laterality Date  . Cataract extraction      No family history on file.  Social History:  reports that she has never smoked. She has never used smokeless tobacco. She reports that she does not drink alcohol or use illicit drugs.  Allergies:  Allergies  Allergen Reactions  . Latex Rash    "to privates"    Medications: I  have reviewed the patient's current medications.  Results for orders placed during the hospital encounter of 02/18/13 (from the past 48 hour(s))  BASIC METABOLIC PANEL     Status: Abnormal   Collection Time    02/22/13  4:20 AM      Result Value Range   Sodium 133 (*) 135 - 145 mEq/L   Potassium 3.8  3.5 - 5.1 mEq/L   Chloride 100  96 - 112 mEq/L   CO2 22  19 - 32 mEq/L   Glucose, Bld 99  70 - 99 mg/dL   BUN 7  6 - 23 mg/dL   Creatinine, Ser 9.60  0.50 - 1.10 mg/dL   Calcium 8.4  8.4 - 45.4 mg/dL   GFR calc non Af Amer 87 (*) >90 mL/min   GFR calc Af Amer >90  >90 mL/min   Comment: (NOTE)     The eGFR has been calculated using the CKD EPI equation.     This calculation has not been validated in all clinical situations.     eGFR's persistently <90 mL/min signify possible Chronic Kidney     Disease.  CBC     Status: Abnormal   Collection Time    02/22/13  4:20 AM      Result Value Range   WBC 6.7  4.0 - 10.5 K/uL   RBC 3.72 (*) 3.87 - 5.11 MIL/uL   Hemoglobin 11.4 (*) 12.0 - 15.0 g/dL   HCT 09.8 (*) 11.9 - 14.7 %   MCV 89.5  78.0 - 100.0 fL   MCH 30.6  26.0 - 34.0 pg  MCHC 34.2  30.0 - 36.0 g/dL   RDW 81.1  91.4 - 78.2 %   Platelets 303  150 - 400 K/uL  CBC     Status: Abnormal   Collection Time    02/23/13  5:50 AM      Result Value Range   WBC 6.1  4.0 - 10.5 K/uL   RBC 3.32 (*) 3.87 - 5.11 MIL/uL   Hemoglobin 10.3 (*) 12.0 - 15.0 g/dL   HCT 95.6 (*) 21.3 - 08.6 %   MCV 88.0  78.0 - 100.0 fL   MCH 31.0  26.0 - 34.0 pg   MCHC 35.3  30.0 - 36.0 g/dL   RDW 57.8  46.9 - 62.9 %   Platelets 270  150 - 400 K/uL  BASIC METABOLIC PANEL     Status: Abnormal   Collection Time    02/23/13  5:50 AM      Result Value Range   Sodium 132 (*) 135 - 145 mEq/L   Potassium 3.5  3.5 - 5.1 mEq/L   Chloride 101  96 - 112 mEq/L   CO2 22  19 - 32 mEq/L   Glucose, Bld 110 (*) 70 - 99 mg/dL   BUN 6  6 - 23 mg/dL   Creatinine, Ser 5.28  0.50 - 1.10 mg/dL   Calcium 8.8  8.4 - 41.3  mg/dL   GFR calc non Af Amer 90 (*) >90 mL/min   GFR calc Af Amer >90  >90 mL/min   Comment: (NOTE)     The eGFR has been calculated using the CKD EPI equation.     This calculation has not been validated in all clinical situations.     eGFR's persistently <90 mL/min signify possible Chronic Kidney     Disease.    No results found.  Positive for anxiety, learning difficulty, obsessive compulsive disorder and sleep disturbance, cognitive deficits. Blood pressure 131/57, pulse 78, temperature 98 F (36.7 C), temperature source Oral, resp. rate 20, height 5\' 2"  (1.575 m), weight 48.49 kg (106 lb 14.4 oz), SpO2 98.00%.   Assessment/Plan: Delirium with psychosis Anxiety disorder not other specified  Recommendation: Increase fluoxetine 20 mg daily for anxiety and OCD symptoms Continue risperidone disintegrating tablets 0.5 mg twice daily for agitation  Refer to the geriatric psych hospitalization for further stabilization Contact psychiatric social service for appropriate bed available in local area  Appreciate psychiatric consultation and followup as needed  [name],JANARDHAHA R. 02/23/2013, 6:09 PM

## 2013-02-23 NOTE — Progress Notes (Signed)
[facility] PROGRESS NOTE  Nichole Foster [facility] ZOX:[id number] DOB: June 28, 1940 DOA: 02/18/2013 PCP: Provider Not In System   Interim summary Patient is a pleasant 72 year old female with a history of cognitive impairment, hyponatremia, who was recently discharged from our service on 02/13/2013 at which time she presented with acute encephalopathy. During that hospitalization she presented with a sodium of 128, improving to 130 after gentle IV fluid hydration. Mental status changes had improved and she was discharged in stable condition.  She presented to the emergent apartment on 02/18/2013 with increased confusion, agitation, and psychosis. Initial lab work showed a sodium of 125. She was started on IV fluids with normal saline. Neurology was consulted as if she had an MRI of brain performed on 02/19/2013 which showed no acute intracranial abnormality. EEG was also performed on 02/21/2013 which was interpreted as normal by neurology. Despite improvement to her sodium levels with sodium improving to 133 on 02/22/2013 from 125 on admission, she has not improved in mental status, and has become increasingly  hypervigilant, having pressured words, racing thoughts, agitated, psychotic features, with ongoing confusion. On 10/27 I gave her a dose of Seroquel 25 mg, which did not improve symptoms. Psychiatry has been consulted. Other issues addressed during this hospitalization include patient reporting pain on swallowing. I consulted Dr. Emeline Darling of ENT who performed laryngoscopy on 02/21/2013. He felt that vallecular cysts could be accounting for some of her odynophagia. After this it seemed that symptoms improved.                                                                                                                Assessment/Plan: 1. Delirium. Appears to have stabilized. Dementia and neurology work up negative. Psychiatry consulted and recommended inpatient psychiatry hospitalization . She was also  started on risperidone and prozac. Awaiting inpatient psych bed availability.  2. Hyponatremia. Improving to 132 on a.m. lab work. She remains on normal saline at 75 mL per hour , which can be discontinued in a day.  3. Oral Ulcers. ENT consulted, appreciate recommendations. Currently on magic mouthwashes. She can see any MD MDs at [facility] as an outpatient to be set up for outpatient direct laryngoscopy and removal of the vallecular cysts once she is discharged. 4. Hypothyroidism. TSH with in normal range  5. History of CVA. Patient patient on Aggrenox. 6. Hypertension. Continue lisinopril 20 mg by mouth daily  Code Status: Full code Family Communication: none at bedside Disposition Plan: inpatient psychiatry hospitalization.  Consultants:  Neurology   HPI/Subjective: No new complaints.  Objective: Filed Vitals:   02/23/13 0408  BP: 176/73  Pulse: 94  Temp: 98.8 F (37.1 C)  Resp: 19    Intake/Output Summary (Last 24 hours) at 02/23/13 1023 Last data filed at 02/22/13 1900  Gross per 24 hour  Intake    220 ml  Output    350 ml  Net   -130 ml   Filed Weights   02/20/13 2141 02/21/13 2048 02/22/13 2035  Weight: 48.6 kg (107 lb  2.3 oz) 48.079 kg (105 lb 15.9 oz) 48.49 kg (106 lb 14.4 oz)    Exam:   General:  Hyperactive,  Cardiovascular: Regular rate rhythm normal S1-S2  Respiratory: Clear to auscultation bilaterally  Abdomen: Soft nontender nondistended  Musculoskeletal: No edema  Neurological: She continues to have a nonfocal neurologic exam. 5/5 muscle strength to all extremities.    Data Reviewed: Basic Metabolic Panel:  Recent Labs Lab 02/19/13 0615 02/20/13 0800 02/21/13 0944 02/22/13 0420 02/23/13 0550  NA 125* 128* 129* 133* 132*  K 3.6 3.8 4.0 3.8 3.5  CL 91* 97 97 100 101  CO2 23 22 24 22 22   GLUCOSE 118* 93 196* 99 110*  BUN 9 10 8 7 6   CREATININE 0.78 0.70 0.68 0.62 0.57  CALCIUM 8.5 8.2* 8.2* 8.4 8.8   Liver Function  Tests:  Recent Labs Lab 02/18/13 1825  AST 44*  ALT 37*  ALKPHOS 62  BILITOT 0.4  PROT 7.3  ALBUMIN 4.2   No results found for this basename: LIPASE, AMYLASE,  in the last 168 hours No results found for this basename: AMMONIA,  in the last 168 hours CBC:  Recent Labs Lab 02/18/13 1825 02/18/13 2205 02/19/13 0615 02/22/13 0420 02/23/13 0550  WBC 6.0 6.2 6.7 6.7 6.1  NEUTROABS 4.4  --   --   --   --   HGB 11.5* 11.4* 10.2* 11.4* 10.3*  HCT 32.4* 32.2* 29.4* 33.3* 29.2*  MCV 87.8 87.3 88.0 89.5 88.0  PLT 330 303 277 303 270   Cardiac Enzymes:  Recent Labs Lab 02/18/13 2207 02/19/13 0615 02/19/13 1056  TROPONINI <0.30 <0.30 <0.30   BNP (last 3 results) No results found for this basename: PROBNP,  in the last 8760 hours CBG: No results found for this basename: GLUCAP,  in the last 168 hours  No results found for this or any previous visit (from the past 240 hour(s)).   Studies: No results found.  Scheduled Meds: . aspirin EC  81 mg Oral Daily  . dipyridamole-aspirin  1 capsule Oral BID  . docusate sodium  100 mg Oral BID  . enoxaparin (LOVENOX) injection  30 mg Subcutaneous Q24H  . FLUoxetine  10 mg Oral Daily  . gatorade (BH)  240 mL Oral BID  . haloperidol lactate  2 mg Intravenous Once  . levothyroxine  50 mcg Oral QAC breakfast  . lisinopril  20 mg Oral Daily  . risperiDONE  0.5 mg Oral BID  . sodium chloride  2 spray Each Nare TID  . sodium chloride  3 mL Intravenous Q12H   Continuous Infusions: . sodium chloride 75 mL/hr at 02/21/13 1341    Principal Problem:   Acute encephalopathy Active Problems:   Hyponatremia   Thyroid disease   Hypertension   High cholesterol    Time spent: 25    [facility]  [facility] Pager [phone]. If 7PM-7AM, please contact night-coverage at [email or web address], password [facility] 02/23/2013, 10:23 AM  LOS: 5 days

## 2013-02-24 MED ORDER — SODIUM CHLORIDE 0.9 % IJ SOLN
3.0000 mL | Freq: Two times a day (BID) | INTRAMUSCULAR | Status: DC
  Administered 2013-02-27: 3 mL via INTRAVENOUS

## 2013-02-24 MED ORDER — SENNOSIDES-DOCUSATE SODIUM 8.6-50 MG PO TABS
2.0000 | ORAL_TABLET | Freq: Two times a day (BID) | ORAL | Status: DC
  Administered 2013-02-24 – 2013-02-28 (×7): 2 via ORAL
  Filled 2013-02-24 (×10): qty 2

## 2013-02-24 MED ORDER — POLYETHYLENE GLYCOL 3350 17 G PO PACK
17.0000 g | PACK | Freq: Every day | ORAL | Status: DC | PRN
  Filled 2013-02-24: qty 1

## 2013-02-24 NOTE — Progress Notes (Signed)
Pt did not want to be hooked to IV fluids last night,made her agitated,and trying to remove,left IV saline locked pt calm.

## 2013-02-24 NOTE — Progress Notes (Signed)
02/24/2013 11:24 AM  Pt abdomen distended on exam this am.  Pt denies any nausea or vomiting, states that she is passing gas.  Pt was disimpacted by RN two days ago per report.  Pt is tender to lower quadrants bilaterally.  Dr. Blake Divine made aware, no orders received at this time.  Will continue to monitor patient. Theadora Rama

## 2013-02-24 NOTE — Progress Notes (Addendum)
[facility] PROGRESS NOTE  Nichole Foster [facility] QIO:[id number] DOB: October 08, 1940 DOA: 02/18/2013 PCP: Provider Not In System   Interim summary Patient is a pleasant 72 year old female with a history of cognitive impairment, hyponatremia, who was recently discharged from our service on 02/13/2013 at which time she presented with acute encephalopathy. During that hospitalization she presented with a sodium of 128, improving to 130 after gentle IV fluid hydration. Mental status changes had improved and she was discharged in stable condition.  She presented to the emergent apartment on 02/18/2013 with increased confusion, agitation, and psychosis. Initial lab work showed a sodium of 125. She was started on IV fluids with normal saline. Neurology was consulted as if she had an MRI of brain performed on 02/19/2013 which showed no acute intracranial abnormality. EEG was also performed on 02/21/2013 which was interpreted as normal by neurology. Despite improvement to her sodium levels with sodium improving to 133 on 02/22/2013 from 125 on admission, she has not improved in mental status, and has become increasingly  hypervigilant, having pressured words, racing thoughts, agitated, psychotic features, with ongoing confusion. On 10/27 I gave her a dose of Seroquel 25 mg, which did not improve symptoms. Psychiatry has been consulted. Other issues addressed during this hospitalization include patient reporting pain on swallowing. I consulted Dr. Emeline Darling of ENT who performed laryngoscopy on 02/21/2013. He felt that vallecular cysts could be accounting for some of her odynophagia. After this it seemed that symptoms improved.                                                                                                                Assessment/Plan: 1. Delirium. Appears to have stabilized. Dementia and neurology work up negative. Psychiatry consulted and recommended inpatient psychiatry hospitalization . She was also  started on risperidone and prozac. Awaiting inpatient psych bed availability.  2. Hyponatremia. Improving to 132 on a.m. lab work. She remains on normal saline at 75 mL per hour , which has been discontinued 3. Oral Ulcers. ENT consulted, appreciate recommendations. Currently on magic mouthwashes. She can see any MD MDs at [facility] as an outpatient to be set up for outpatient direct laryngoscopy and removal of the vallecular cysts once she is discharged. 4. Hypothyroidism. TSH with in normal range  5. History of CVA. Patient patient on Aggrenox. 6. Hypertension. Continue lisinopril 20 mg by mouth daily  Code Status: Full code Family Communication: none at bedside Disposition Plan: inpatient psychiatry hospitalization.  Consultants:  Neurology   HPI/Subjective: No new complaints.  Objective: Filed Vitals:   02/24/13 1430  BP: 135/71  Pulse: 73  Temp: 97.3 F (36.3 C)  Resp: 16    Intake/Output Summary (Last 24 hours) at 02/24/13 1731 Last data filed at 02/24/13 0934  Gross per 24 hour  Intake    243 ml  Output      0 ml  Net    243 ml   Filed Weights   02/21/13 2048 02/22/13 2035 02/23/13 2229  Weight: 48.079 kg (105 lb 15.9  oz) 48.49 kg (106 lb 14.4 oz) 49.168 kg (108 lb 6.3 oz)    Exam:   General:  Hyperactive,  Cardiovascular: Regular rate rhythm normal S1-S2  Respiratory: Clear to auscultation bilaterally  Abdomen: Soft nontender nondistended  Musculoskeletal: No edema  Neurological: She continues to have a nonfocal neurologic exam. 5/5 muscle strength to all extremities.    Data Reviewed: Basic Metabolic Panel:  Recent Labs Lab 02/19/13 0615 02/20/13 0800 02/21/13 0944 02/22/13 0420 02/23/13 0550  NA 125* 128* 129* 133* 132*  K 3.6 3.8 4.0 3.8 3.5  CL 91* 97 97 100 101  CO2 23 22 24 22 22   GLUCOSE 118* 93 196* 99 110*  BUN 9 10 8 7 6   CREATININE 0.78 0.70 0.68 0.62 0.57  CALCIUM 8.5 8.2* 8.2* 8.4 8.8   Liver Function Tests:  Recent  Labs Lab 02/18/13 1825  AST 44*  ALT 37*  ALKPHOS 62  BILITOT 0.4  PROT 7.3  ALBUMIN 4.2   No results found for this basename: LIPASE, AMYLASE,  in the last 168 hours No results found for this basename: AMMONIA,  in the last 168 hours CBC:  Recent Labs Lab 02/18/13 1825 02/18/13 2205 02/19/13 0615 02/22/13 0420 02/23/13 0550  WBC 6.0 6.2 6.7 6.7 6.1  NEUTROABS 4.4  --   --   --   --   HGB 11.5* 11.4* 10.2* 11.4* 10.3*  HCT 32.4* 32.2* 29.4* 33.3* 29.2*  MCV 87.8 87.3 88.0 89.5 88.0  PLT 330 303 277 303 270   Cardiac Enzymes:  Recent Labs Lab 02/18/13 2207 02/19/13 0615 02/19/13 1056  TROPONINI <0.30 <0.30 <0.30   BNP (last 3 results) No results found for this basename: PROBNP,  in the last 8760 hours CBG: No results found for this basename: GLUCAP,  in the last 168 hours  No results found for this or any previous visit (from the past 240 hour(s)).   Studies: No results found.  Scheduled Meds: . antiseptic oral rinse  15 mL Mouth Rinse BID  . aspirin EC  81 mg Oral Daily  . dipyridamole-aspirin  1 capsule Oral BID  . enoxaparin (LOVENOX) injection  30 mg Subcutaneous Q24H  . FLUoxetine  10 mg Oral Daily  . gatorade (BH)  240 mL Oral BID  . haloperidol lactate  2 mg Intravenous Once  . hydrocortisone   Rectal QID  . levothyroxine  50 mcg Oral QAC breakfast  . lisinopril  20 mg Oral Daily  . risperiDONE  0.5 mg Oral BID  . senna-docusate  2 tablet Oral BID  . sodium chloride  2 spray Each Nare TID  . sodium chloride  3 mL Intravenous Q12H  . sodium chloride  3 mL Intravenous Q12H   Continuous Infusions:    Principal Problem:   Acute encephalopathy Active Problems:   Hyponatremia   Thyroid disease   Hypertension   High cholesterol    Time spent: 25    [facility]  [facility] Pager [phone]. If 7PM-7AM, please contact night-coverage at [email or web address], password [facility] 02/24/2013, 5:31 PM  LOS: 6 days

## 2013-02-24 NOTE — Clinical Social Work Psych Note (Addendum)
Psych CSW has referred pt to the following geriatric inpatient psychiatric facilities: Sandre Kitty [phone] [facility] [phone] - [facility] [phone] - denied due to medical acuity (specifically: hypernatremia) [facility] [phone] - [facility] [phone]

## 2013-02-25 LAB — CREATININE, SERUM
Creatinine, Ser: 0.63 mg/dL (ref 0.50–1.10)
GFR calc Af Amer: >90 mL/min (ref >90)
GFR calc non Af Amer: 87 mL/min — ABNORMAL LOW (ref >90)

## 2013-02-26 LAB — GLUCOSE, CAPILLARY

## 2013-02-26 NOTE — Progress Notes (Signed)
[facility] PROGRESS NOTE  Nichole Foster ZOX:[id number] DOB: 01-26-1941 DOA: 02/18/2013 PCP: Provider Not In System   Interim summary Patient is a pleasant 72 year old female with a history of cognitive impairment, hyponatremia, who was recently discharged from our service on 02/13/2013 at which time she presented with acute encephalopathy. During that hospitalization she presented with a sodium of 128, improving to 130 after gentle IV fluid hydration. Mental status changes had improved and she was discharged in stable condition.  She presented to the emergent apartment on 02/18/2013 with increased confusion, agitation, and psychosis. Initial lab work showed a sodium of 125. She was started on IV fluids with normal saline. Neurology was consulted as if she had an MRI of brain performed on 02/19/2013 which showed no acute intracranial abnormality. EEG was also performed on 02/21/2013 which was interpreted as normal by neurology. Despite improvement to her sodium levels with sodium improving to 133 on 02/22/2013 from 125 on admission, she has not improved in mental status, and has become increasingly  hypervigilant, having pressured words, racing thoughts, agitated, psychotic features, with ongoing confusion. On 10/27 I gave her a dose of Seroquel 25 mg, which did not improve symptoms. Psychiatry has been consulted. Other issues addressed during this hospitalization include patient reporting pain on swallowing. I consulted Dr. Emeline Darling of ENT who performed laryngoscopy on 02/21/2013. He felt that vallecular cysts could be accounting for some of her odynophagia. After this it seemed that symptoms improved.                                                                                                                Assessment/Plan: 1. Delirium. Appears to have stabilized. Dementia and neurology work up negative. Psychiatry consulted and recommended inpatient psychiatry hospitalization . She was also  started on risperidone and prozac. Awaiting inpatient psych bed availability.  2. Hyponatremia. Improving to 132 on a.m. lab work. She remains on normal saline at 75 mL per hour , which has been discontinued 3. Oral Ulcers. ENT consulted, appreciate recommendations. Currently on magic mouthwashes. She can see any MD MDs at [facility] as an outpatient to be set up for outpatient direct laryngoscopy and removal of the vallecular cysts once she is discharged. 4. Hypothyroidism. TSH with in normal range  5. History of CVA. Patient patient on Aggrenox. 6. Hypertension. Continue lisinopril 20 mg by mouth daily  Code Status: Full code Family Communication: none at bedside Disposition Plan: inpatient psychiatry hospitalization.  Consultants:  Neurology   HPI/Subjective: No new complaints.  Objective: Filed Vitals:   02/26/13 0431  BP: 147/61  Pulse: 72  Temp: 98.6 F (37 C)  Resp: 16   No intake or output data in the 24 hours ending 02/26/13 1035 Filed Weights   02/22/13 2035 02/23/13 2229 02/25/13 2057  Weight: 48.49 kg (106 lb 14.4 oz) 49.168 kg (108 lb 6.3 oz) 48 kg (105 lb 13.1 oz)    Exam:   General:  Hyperactive,  Cardiovascular: Regular rate rhythm normal S1-S2  Respiratory: Clear to  auscultation bilaterally  Abdomen: Soft nontender nondistended  Musculoskeletal: No edema  Neurological: She continues to have a nonfocal neurologic exam. 5/5 muscle strength to all extremities.    Data Reviewed: Basic Metabolic Panel:  Recent Labs Lab 02/20/13 0800 02/21/13 0944 02/22/13 0420 02/23/13 0550 02/25/13 0525  NA 128* 129* 133* 132*  --   K 3.8 4.0 3.8 3.5  --   CL 97 97 100 101  --   CO2 22 24 22 22   --   GLUCOSE 93 196* 99 110*  --   BUN 10 8 7 6   --   CREATININE 0.70 0.68 0.62 0.57 0.63  CALCIUM 8.2* 8.2* 8.4 8.8  --    Liver Function Tests: No results found for this basename: AST, ALT, ALKPHOS, BILITOT, PROT, ALBUMIN,  in the last 168 hours No results  found for this basename: LIPASE, AMYLASE,  in the last 168 hours No results found for this basename: AMMONIA,  in the last 168 hours CBC:  Recent Labs Lab 02/22/13 0420 02/23/13 0550  WBC 6.7 6.1  HGB 11.4* 10.3*  HCT 33.3* 29.2*  MCV 89.5 88.0  PLT 303 270   Cardiac Enzymes:  Recent Labs Lab 02/19/13 1056  TROPONINI <0.30   BNP (last 3 results) No results found for this basename: PROBNP,  in the last 8760 hours CBG:  Recent Labs Lab 02/26/13 0754  GLUCAP 106*    No results found for this or any previous visit (from the past 240 hour(s)).   Studies: No results found.  Scheduled Meds: . antiseptic oral rinse  15 mL Mouth Rinse BID  . aspirin EC  81 mg Oral Daily  . dipyridamole-aspirin  1 capsule Oral BID  . enoxaparin (LOVENOX) injection  30 mg Subcutaneous Q24H  . FLUoxetine  10 mg Oral Daily  . gatorade (BH)  240 mL Oral BID  . haloperidol lactate  2 mg Intravenous Once  . hydrocortisone   Rectal QID  . levothyroxine  50 mcg Oral QAC breakfast  . lisinopril  20 mg Oral Daily  . risperiDONE  0.5 mg Oral BID  . senna-docusate  2 tablet Oral BID  . sodium chloride  2 spray Each Nare TID  . sodium chloride  3 mL Intravenous Q12H  . sodium chloride  3 mL Intravenous Q12H   Continuous Infusions:    Principal Problem:   Acute encephalopathy Active Problems:   Hyponatremia   Thyroid disease   Hypertension   High cholesterol    Time spent: 25    [facility]  [facility] Pager [phone]. If 7PM-7AM, please contact night-coverage at [email or web address], password [facility] 02/26/2013, 10:35 AM  LOS: 8 days

## 2013-02-26 NOTE — Progress Notes (Signed)
[facility] PROGRESS NOTE  Neri D Umbach MRN:[id number] DOB: 05/28/1940 DOA: 02/18/2013 PCP: Provider Not In System   Interim summary Patient is a pleasant 72-year-old female with a history of cognitive impairment, hyponatremia, who was recently discharged from our service on 02/13/2013 at which time she presented with acute encephalopathy. During that hospitalization she presented with a sodium of 128, improving to 130 after gentle IV fluid hydration. Mental status changes had improved and she was discharged in stable condition.  She presented to the emergent apartment on 02/18/2013 with increased confusion, agitation, and psychosis. Initial lab work showed a sodium of 125. She was started on IV fluids with normal saline. Neurology was consulted as if she had an MRI of brain performed on 02/19/2013 which showed no acute intracranial abnormality. EEG was also performed on 02/21/2013 which was interpreted as normal by neurology. Despite improvement to her sodium levels with sodium improving to 133 on 02/22/2013 from 125 on admission, she has not improved in mental status, and has become increasingly  hypervigilant, having pressured words, racing thoughts, agitated, psychotic features, with ongoing confusion. On 10/27 I gave her a dose of Seroquel 25 mg, which did not improve symptoms. Psychiatry has been consulted. Other issues addressed during this hospitalization include patient reporting pain on swallowing. I consulted Dr. Gore of ENT who performed laryngoscopy on 02/21/2013. He felt that vallecular cysts could be accounting for some of her odynophagia. After this it seemed that symptoms improved.                                                                                                                Assessment/Plan: 1. Delirium. Appears to have stabilized. Dementia and neurology work up negative. Psychiatry consulted and recommended inpatient psychiatry hospitalization . She was also  started on risperidone and prozac. Awaiting inpatient psych bed availability.  2. Hyponatremia. Improving to 132 on a.m. lab work. She remains on normal saline at 75 mL per hour , which has been discontinued 3. Oral Ulcers. ENT consulted, appreciate recommendations. Currently on magic mouthwashes. She can see any MD MDs at [facility] as an outpatient to be set up for outpatient direct laryngoscopy and removal of the vallecular cysts once she is discharged. 4. Hypothyroidism. TSH with in normal range  5. History of CVA. Patient patient on Aggrenox. 6. Hypertension. Continue lisinopril 20 mg by mouth daily  Code Status: Full code Family Communication: none at bedside Disposition Plan: inpatient psychiatry hospitalization.  Consultants:  Neurology   HPI/Subjective: No new complaints.  Objective: Filed Vitals:   02/26/13 0431  BP: 147/61  Pulse: 72  Temp: 98.6 F (37 C)  Resp: 16   No intake or output data in the 24 hours ending 02/26/13 1035 Filed Weights   02/22/13 2035 02/23/13 2229 02/25/13 2057  Weight: 48.49 kg (106 lb 14.4 oz) 49.168 kg (108 lb 6.3 oz) 48 kg (105 lb 13.1 oz)    Exam:   General:  Hyperactive,  Cardiovascular: Regular rate rhythm normal S1-S2  Respiratory: Clear to   auscultation bilaterally  Abdomen: Soft nontender nondistended  Musculoskeletal: No edema  Neurological: She continues to have a nonfocal neurologic exam. 5/5 muscle strength to all extremities.    Data Reviewed: Basic Metabolic Panel:  Recent Labs Lab 02/20/13 0800 02/21/13 0944 02/22/13 0420 02/23/13 0550 02/25/13 0525  NA 128* 129* 133* 132*  --   K 3.8 4.0 3.8 3.5  --   CL 97 97 100 101  --   CO2 22 24 22 22  --   GLUCOSE 93 196* 99 110*  --   BUN 10 8 7 6  --   CREATININE 0.70 0.68 0.62 0.57 0.63  CALCIUM 8.2* 8.2* 8.4 8.8  --    Liver Function Tests: No results found for this basename: AST, ALT, ALKPHOS, BILITOT, PROT, ALBUMIN,  in the last 168 hours No results  found for this basename: LIPASE, AMYLASE,  in the last 168 hours No results found for this basename: AMMONIA,  in the last 168 hours CBC:  Recent Labs Lab 02/22/13 0420 02/23/13 0550  WBC 6.7 6.1  HGB 11.4* 10.3*  HCT 33.3* 29.2*  MCV 89.5 88.0  PLT 303 270   Cardiac Enzymes:  Recent Labs Lab 02/19/13 1056  TROPONINI <0.30   BNP (last 3 results) No results found for this basename: PROBNP,  in the last 8760 hours CBG:  Recent Labs Lab 02/26/13 0754  GLUCAP 106*    No results found for this or any previous visit (from the past 240 hour(s)).   Studies: No results found.  Scheduled Meds: . antiseptic oral rinse  15 mL Mouth Rinse BID  . aspirin EC  81 mg Oral Daily  . dipyridamole-aspirin  1 capsule Oral BID  . enoxaparin (LOVENOX) injection  30 mg Subcutaneous Q24H  . FLUoxetine  10 mg Oral Daily  . gatorade (BH)  240 mL Oral BID  . haloperidol lactate  2 mg Intravenous Once  . hydrocortisone   Rectal QID  . levothyroxine  50 mcg Oral QAC breakfast  . lisinopril  20 mg Oral Daily  . risperiDONE  0.5 mg Oral BID  . senna-docusate  2 tablet Oral BID  . sodium chloride  2 spray Each Nare TID  . sodium chloride  3 mL Intravenous Q12H  . sodium chloride  3 mL Intravenous Q12H   Continuous Infusions:    Principal Problem:   Acute encephalopathy Active Problems:   Hyponatremia   Thyroid disease   Hypertension   High cholesterol    Time spent: 25    Samanda Buske  [facility] Pager [phone]. If 7PM-7AM, please contact night-coverage at [email or web address], password [email or web address] 02/26/2013, 10:35 AM  LOS: 8 days             

## 2013-02-27 NOTE — Progress Notes (Signed)
[facility] PROGRESS NOTE  Nichole Foster [facility] ION:[id number] DOB: [birth year] DOA: 02/18/2013 PCP: Provider Not In System   Interim summary Patient is a pleasant 72 year old female with a history of cognitive impairment, hyponatremia, who was recently discharged from our service on 02/13/2013 at which time she presented with acute encephalopathy. During that hospitalization she presented with a sodium of 128, improving to 130 after gentle IV fluid hydration. Mental status changes had improved and she was discharged in stable condition.  She presented to the emergent apartment on 02/18/2013 with increased confusion, agitation, and psychosis. Initial lab work showed a sodium of 125. She was started on IV fluids with normal saline. Neurology was consulted as if she had an MRI of brain performed on 02/19/2013 which showed no acute intracranial abnormality. EEG was also performed on 02/21/2013 which was interpreted as normal by neurology. Despite improvement to her sodium levels with sodium improving to 133 on 02/22/2013 from 125 on admission, she has not improved in mental status, and has become increasingly  hypervigilant, having pressured words, racing thoughts, agitated, psychotic features, with ongoing confusion. On 10/27 I gave her a dose of Seroquel 25 mg, which did not improve symptoms. Psychiatry has been consulted. Other issues addressed during this hospitalization include patient reporting pain on swallowing. I consulted Dr. Emeline Darling of ENT who performed laryngoscopy on 02/21/2013. He felt that vallecular cysts could be accounting for some of her odynophagia. After this it seemed that symptoms improved.                                                                                                                Assessment/Plan: 1. Delirium. Appears to have stabilized. Dementia and neurology work up negative. Psychiatry consulted and recommended inpatient psychiatry hospitalization . She was also  started on risperidone and prozac. Awaiting inpatient psych bed availability.  2. Hyponatremia. Improving to 132 on a.m. lab work. She remains on normal saline at 75 mL per hour , which has been discontinued 3. Oral Ulcers. ENT consulted, appreciate recommendations. Currently on magic mouthwashes. She can see any MD MDs at [facility] as an outpatient to be set up for outpatient direct laryngoscopy and removal of the vallecular cysts once she is discharged. 4. Hypothyroidism. TSH with in normal range  5. History of CVA. Patient patient on Aggrenox. 6. Hypertension. Continue lisinopril 20 mg by mouth daily  Code Status: Full code Family Communication: none at bedside Disposition Plan: inpatient psychiatry hospitalization.  Consultants:  Neurology   HPI/Subjective: Pt reported burning urination briefly yesterday. UA ORDERED.  Objective: Filed Vitals:   02/27/13 0830  BP: 122/65  Pulse: 88  Temp: 98 F (36.7 C)  Resp: 18    Intake/Output Summary (Last 24 hours) at 02/27/13 1653 Last data filed at 02/27/13 0815  Gross per 24 hour  Intake    120 ml  Output    300 ml  Net   -180 ml   Filed Weights   02/23/13 2229 02/25/13 2057 02/26/13 2024  Weight: 49.168 kg (108  lb 6.3 oz) 48 kg (105 lb 13.1 oz) 47.6 kg (104 lb 15 oz)    Exam:   General:  Hyperactive,  Cardiovascular: Regular rate rhythm normal S1-S2  Respiratory: Clear to auscultation bilaterally  Abdomen: Soft nontender nondistended  Musculoskeletal: No edema  Neurological: She continues to have a nonfocal neurologic exam. 5/5 muscle strength to all extremities.    Data Reviewed: Basic Metabolic Panel:  Recent Labs Lab 02/21/13 0944 02/22/13 0420 02/23/13 0550 02/25/13 0525  NA 129* 133* 132*  --   K 4.0 3.8 3.5  --   CL 97 100 101  --   CO2 24 22 22   --   GLUCOSE 196* 99 110*  --   BUN 8 7 6   --   CREATININE 0.68 0.62 0.57 0.63  CALCIUM 8.2* 8.4 8.8  --    Liver Function Tests: No results  found for this basename: AST, ALT, ALKPHOS, BILITOT, PROT, ALBUMIN,  in the last 168 hours No results found for this basename: LIPASE, AMYLASE,  in the last 168 hours No results found for this basename: AMMONIA,  in the last 168 hours CBC:  Recent Labs Lab 02/22/13 0420 02/23/13 0550  WBC 6.7 6.1  HGB 11.4* 10.3*  HCT 33.3* 29.2*  MCV 89.5 88.0  PLT 303 270   Cardiac Enzymes: No results found for this basename: CKTOTAL, CKMB, CKMBINDEX, TROPONINI,  in the last 168 hours BNP (last 3 results) No results found for this basename: PROBNP,  in the last 8760 hours CBG:  Recent Labs Lab 02/26/13 0754  GLUCAP 106*    No results found for this or any previous visit (from the past 240 hour(s)).   Studies: No results found.  Scheduled Meds: . antiseptic oral rinse  15 mL Mouth Rinse BID  . aspirin EC  81 mg Oral Daily  . dipyridamole-aspirin  1 capsule Oral BID  . enoxaparin (LOVENOX) injection  30 mg Subcutaneous Q24H  . FLUoxetine  10 mg Oral Daily  . gatorade (BH)  240 mL Oral BID  . haloperidol lactate  2 mg Intravenous Once  . hydrocortisone   Rectal QID  . levothyroxine  50 mcg Oral QAC breakfast  . lisinopril  20 mg Oral Daily  . risperiDONE  0.5 mg Oral BID  . senna-docusate  2 tablet Oral BID  . sodium chloride  2 spray Each Nare TID  . sodium chloride  3 mL Intravenous Q12H  . sodium chloride  3 mL Intravenous Q12H   Continuous Infusions:    Principal Problem:   Acute encephalopathy Active Problems:   Hyponatremia   Thyroid disease   Hypertension   High cholesterol    Time spent: 25    [facility]  [facility] Pager [phone]. If 7PM-7AM, please contact night-coverage at [email or web address], password [facility] 02/27/2013, 4:53 PM  LOS: 9 days

## 2013-02-28 DIAGNOSIS — G9340 Encephalopathy, unspecified: Secondary | ICD-10-CM

## 2013-02-28 DIAGNOSIS — E871 Hypo-osmolality and hyponatremia: Secondary | ICD-10-CM

## 2013-02-28 DIAGNOSIS — I10 Essential (primary) hypertension: Secondary | ICD-10-CM

## 2013-02-28 DIAGNOSIS — E079 Disorder of thyroid, unspecified: Secondary | ICD-10-CM

## 2013-02-28 MED ORDER — FLUOXETINE HCL 10 MG PO CAPS: 10.0000 mg | ORAL_CAPSULE | Freq: Every day | ORAL | Status: DC

## 2013-02-28 MED ORDER — RISPERIDONE 0.5 MG PO TBDP: 0.5000 mg | ORAL_TABLET | Freq: Two times a day (BID) | ORAL | Status: DC

## 2013-02-28 MED ORDER — HYDROCORTISONE 2.5 % RE CREA: TOPICAL_CREAM | Freq: Four times a day (QID) | RECTAL | Status: DC

## 2013-02-28 MED ORDER — SENNOSIDES-DOCUSATE SODIUM 8.6-50 MG PO TABS: 2.0000 | ORAL_TABLET | Freq: Two times a day (BID) | ORAL | Status: DC

## 2013-02-28 MED ORDER — POLYETHYLENE GLYCOL 3350 17 G PO PACK: 17.0000 g | PACK | Freq: Every day | ORAL | Status: AC | PRN

## 2013-02-28 NOTE — Discharge Summary (Signed)
Physician Discharge Summary  Nichole Foster NFA:[id number] DOB: 1940/10/07 DOA: 02/18/2013  PCP: Provider Not In System  Admit date: 02/18/2013 Discharge date: 02/28/2013  Time spent: 32 minutes  Recommendations for Outpatient Follow-up:  1. Follow up with PCP in one week 2. fiollow up with ENT as needed to remove the vallecular cysts.   Discharge Diagnoses:  Principal Problem:   Acute encephalopathy Active Problems:   Hyponatremia   Thyroid disease   Hypertension   High cholesterol Delirium with psychosis Anxiety disorder   Discharge Condition: improved  Diet recommendation: regular.   Filed Weights   02/25/13 2057 02/26/13 2024 02/27/13 2118  Weight: 48 kg (105 lb 13.1 oz) 47.6 kg (104 lb 15 oz) 48.127 kg (106 lb 1.6 oz)    History of present illness:   Patient is a pleasant 72 year old female with a history of cognitive impairment, hyponatremia, who was recently discharged from our service on 02/13/2013 at which time she presented with acute encephalopathy. During that hospitalization she presented with a sodium of 128, improving to 130 after gentle IV fluid hydration. Mental status changes had improved and she was discharged in stable condition. She presented to the emergent apartment on 02/18/2013 with increased confusion, agitation, and psychosis. Initial lab work showed a sodium of 125. She was started on IV fluids with normal saline. Neurology was consulted as if she had an MRI of brain performed on 02/19/2013 which showed no acute intracranial abnormality. EEG was also performed on 02/21/2013 which was interpreted as normal by neurology. Despite improvement to her sodium levels with sodium improving to 133 on 02/22/2013 from 125 on admission, she has not improved in mental status, and has become increasingly hypervigilant, having pressured words, racing thoughts, agitated, psychotic features, with ongoing confusion. On 10/27 I gave her a dose of Seroquel 25 mg, which did  not improve symptoms. Psychiatry has been consulted. Other issues addressed during this hospitalization include patient reporting pain on swallowing. I consulted Dr. Emeline Darling of ENT who performed laryngoscopy on 02/21/2013. He felt that vallecular cysts could be accounting for some of her odynophagia. After this it seemed that symptoms improved. She was seen by psychiatrist and recommended inpatient psychiatry admission. She is medically ready to be discharged .   Hospital Course:  Delirium. Appears to have stabilized. Dementia and neurology work up negative. Psychiatry consulted and recommended inpatient psychiatry hospitalization . She was also started on risperidone and prozac. Awaiting inpatient psych bed availability.   Hyponatremia. Improving to 132 on a.m. lab work. She remains on normal saline at 75 mL per hour , which has been discontinued.   Oral Ulcers. ENT consulted, appreciate recommendations. Currently on magic mouthwashes. She can see any MD MDs at [facility] as an outpatient to be set up for outpatient direct laryngoscopy and removal of the vallecular cysts once she is discharged.   Hypothyroidism. TSH with in normal range   History of CVA. Patient patient on Aggrenox.   Hypertension. Continue lisinopril 20 mg by mouth daily  Constipation: resume home senna, colace and miralax.    Procedures: MRI BRAIN Consultations:  Neurology  Psychiatry.  Discharge Exam: Filed Vitals:   02/28/13 0851  BP: 136/51  Pulse: 76  Temp: 98.5 F (36.9 C)  Resp: 18    General: alert afebrile comfortable  Cardiovascular: s1s2 Respiratory: CTAB  Discharge Instructions  Discharge Orders   Future Orders Complete By Expires   Discharge instructions  As directed    Comments:     Follow up  withPCP in one week.       Medication List    STOP taking these medications       COLACE 100 MG capsule  Generic drug:  docusate sodium      TAKE these medications       alendronate 70  MG tablet  Commonly known as:  FOSAMAX  Take 70 mg by mouth every 7 (seven) days. Take with a full glass of water on an empty stomach.     CALCIUM 600 + D PO  Take 1 tablet by mouth 2 (two) times daily.     CRANBERRY PO  Take 500 mg by mouth daily.     dipyridamole-aspirin 200-25 MG per 12 hr capsule  Commonly known as:  AGGRENOX  Take 1 capsule by mouth 2 (two) times daily.     ESTRACE VAGINAL 0.1 MG/GM vaginal cream  Generic drug:  estradiol  Place 2 g vaginally daily.     estradiol 1 MG tablet  Commonly known as:  ESTRACE  Take 1 mg by mouth every Monday, Wednesday, and Friday. Takes at bedtime only.     fexofenadine 180 MG tablet  Commonly known as:  ALLEGRA  Take 90 mg by mouth daily.     FLUoxetine 10 MG capsule  Commonly known as:  PROZAC  Take 1 capsule (10 mg total) by mouth daily.     fluticasone 50 MCG/ACT nasal spray  Commonly known as:  FLONASE  Place 2 sprays into the nose daily.     hydrocortisone 2.5 % rectal cream  Commonly known as:  ANUSOL-HC  Place rectally 4 (four) times daily.     levothyroxine 50 MCG tablet  Commonly known as:  SYNTHROID, LEVOTHROID  Take 50 mcg by mouth daily before breakfast.     lisinopril 20 MG tablet  Commonly known as:  PRINIVIL,ZESTRIL  Take 1 tablet (20 mg total) by mouth daily.     multivitamin with minerals Tabs tablet  Take 1 tablet by mouth daily.     polyethylene glycol packet  Commonly known as:  MIRALAX / GLYCOLAX  Take 17 g by mouth daily as needed.     risperiDONE 0.5 MG disintegrating tablet  Commonly known as:  RISPERDAL M-TABS  Take 1 tablet (0.5 mg total) by mouth 2 (two) times daily.     senna-docusate 8.6-50 MG per tablet  Commonly known as:  Senokot-S  Take 2 tablets by mouth 2 (two) times daily.     simvastatin 10 MG tablet  Commonly known as:  ZOCOR  Take 10 mg by mouth at bedtime.     UNABLE TO FIND  Take 5 mLs by mouth daily. Med Name: flax mill supplement       Allergies   Allergen Reactions  . Latex Rash    "to privates"       Follow-up Information   Follow up with Provider Not In System.       The results of significant diagnostics from this hospitalization (including imaging, microbiology, ancillary and laboratory) are listed below for reference.    Significant Diagnostic Studies: Ct Head Wo Contrast  02/18/2013   CLINICAL DATA:  Altered mental status.  Confusion.  EXAM: CT HEAD WITHOUT CONTRAST  TECHNIQUE: Contiguous axial images were obtained from the base of the skull through the vertex without intravenous contrast.  COMPARISON:  02/12/2013.  FINDINGS: No mass lesion, mass effect, midline shift, hydrocephalus, hemorrhage. No acute territorial cortical ischemia/infarct. Atrophy and chronic ischemic white matter disease is  present.Paranasal sinuses appear within normal limits. Mastoid air cells are clear. There is low attenuation of the intravascular compartment with increased contrast between the dura and sagittal sinus suggesting anemia.  IMPRESSION: 1. Mild atrophy and chronic ischemic white matter disease without acute intracranial abnormality. No interval change. 2. Probable anemia.   Electronically Signed   By: Andreas Newport M.D.   On: 02/18/2013 20:28   Ct Head Wo Contrast  02/12/2013   CLINICAL DATA:  Altered mental status  EXAM: CT HEAD WITHOUT CONTRAST  TECHNIQUE: Contiguous axial images were obtained from the base of the skull through the vertex without intravenous contrast.  COMPARISON:  None.  FINDINGS: Minimal chronic microvascular changes in the deep white matter. No acute intracranial abnormality. Specifically, no hemorrhage, hydrocephalus, mass lesion, acute infarction, or significant intracranial injury. No acute calvarial abnormality. Visualized paranasal sinuses and mastoids clear. Orbital soft tissues unremarkable.  IMPRESSION: No acute intracranial abnormality.   Electronically Signed   By: Charlett Nose M.D.   On: 02/12/2013 16:12    Mr Brain Wo Contrast  02/19/2013   CLINICAL DATA:  Altered mental status. Increased confusion and agitation. Past history of metabolic encephalopathy.  EXAM: MRI HEAD WITHOUT CONTRAST  TECHNIQUE: Multiplanar, multisequence MR imaging was performed. No intravenous contrast was administered.  COMPARISON:  CT head without contrast 02/18/2013. MRI brain 02/01/2009.  FINDINGS: Moderate generalized atrophy and diffuse white matter disease is not significantly changed from prior exam. No acute infarct, hemorrhage, or mass lesion is present. A remote right cerebellar infarct is evident. Midline structures are within normal limits. Flow is present in the major intracranial arteries. The globes and orbits are intact. The paranasal sinuses and mastoid air cells are clear.  IMPRESSION: 1. Stable atrophy and diffuse white matter disease. This likely reflects the sequelae of chronic microvascular ischemia. 2. Remote lacunar infarcts of the basal ganglia and right cerebellum. 3. No acute intracranial abnormality.   Electronically Signed   By: Gennette Pac M.D.   On: 02/19/2013 13:55   Dg Pelvis Comp Min 3v  02/19/2013   CLINICAL DATA:  Status post fall; concern for pelvic injury.  EXAM: JUDET PELVIS - 3+ VIEW  COMPARISON:  None.  FINDINGS: There is no evidence of fracture or dislocation. Both femoral heads are seated normally within their respective acetabula. No significant degenerative change is appreciated. The sacroiliac joints are unremarkable in appearance. The sacrum and pubic rami appear intact.  The visualized bowel gas pattern is grossly unremarkable in appearance.  IMPRESSION: No evidence of fracture or dislocation.   Electronically Signed   By: Roanna Raider M.D.   On: 02/19/2013 01:11   Dg Chest Port 1 View  02/18/2013   CLINICAL DATA:  Low sodium, weakness, confusion  EXAM: PORTABLE CHEST - 1 VIEW  COMPARISON:  02/02/2009  FINDINGS: Mild left-sided cardiac enlargement. Vascular pattern normal. Lungs  clear.  IMPRESSION: No active disease.   Electronically Signed   By: Esperanza Heir M.D.   On: 02/18/2013 19:17    Microbiology: No results found for this or any previous visit (from the past 240 hour(s)).   Labs: Basic Metabolic Panel:  Recent Labs Lab 02/22/13 0420 02/23/13 0550 02/25/13 0525  NA 133* 132*  --   K 3.8 3.5  --   CL 100 101  --   CO2 22 22  --   GLUCOSE 99 110*  --   BUN 7 6  --   CREATININE 0.62 0.57 0.63  CALCIUM 8.4 8.8  --  Liver Function Tests: No results found for this basename: AST, ALT, ALKPHOS, BILITOT, PROT, ALBUMIN,  in the last 168 hours No results found for this basename: LIPASE, AMYLASE,  in the last 168 hours No results found for this basename: AMMONIA,  in the last 168 hours CBC:  Recent Labs Lab 02/22/13 0420 02/23/13 0550  WBC 6.7 6.1  HGB 11.4* 10.3*  HCT 33.3* 29.2*  MCV 89.5 88.0  PLT 303 270   Cardiac Enzymes: No results found for this basename: CKTOTAL, CKMB, CKMBINDEX, TROPONINI,  in the last 168 hours BNP: BNP (last 3 results) No results found for this basename: PROBNP,  in the last 8760 hours CBG:  Recent Labs Lab 02/26/13 0754  GLUCAP 106*       Signed:  Alessandra Sawdey  [facility] 02/28/2013, 11:31 AM

## 2013-02-28 NOTE — Progress Notes (Signed)
Discharge instructions and prescriptions given to patient's husband. Pt to be discharged to [facility] - pt's husband transporting pt to facility.   Kathlene November, Nycholas Rayner [place]

## 2013-02-28 NOTE — Clinical Social Work Psych Note (Signed)
Pt will be transported to [facility] by: [employer].  [phone]    Psych CSW has confirmed with [facility] that all necessary information has been received.  Sandre Kitty is awaiting today's discharges for pt room to become available.  Pt, husband and medical staff aware.  Vickii Penna, LCSWA [phone]  Clinical Social Work

## 2013-02-28 NOTE — Clinical Social Work Note (Signed)
CSW received a call from [employer].  Charge RN refaxed EKG to [facility].  Charge RN received consent form for pt to sign for admission to geriatric placement.  CSW to have pt consent and fax to [facility].  Vickii Penna, LCSWA [phone]  Clinical Social Work

## 2013-03-26 ENCOUNTER — Emergency Department (HOSPITAL_COMMUNITY)
Admission: EM | Admit: 2013-03-26 | Discharge: 2013-03-26 | Disposition: A | Discharge status: Home / Self Care | Payer: Medicare Other | Source: Home / Self Care | Attending: Family Medicine | Admitting: Family Medicine

## 2013-03-26 ENCOUNTER — Encounter (HOSPITAL_COMMUNITY): Payer: Self-pay | Admitting: Emergency Medicine

## 2013-03-26 ENCOUNTER — Emergency Department (INDEPENDENT_AMBULATORY_CARE_PROVIDER_SITE_OTHER): Payer: Medicare Other

## 2013-03-26 DIAGNOSIS — M533 Sacrococcygeal disorders, not elsewhere classified: Secondary | ICD-10-CM

## 2013-03-26 NOTE — ED Notes (Signed)
Reports falling last night landing on buttocks. C/o tail bone pain. Also hit both elbows, mild pain.  Pt has used ice on tail bone with some relief.

## 2013-03-26 NOTE — ED Provider Notes (Signed)
Nichole Foster is a 72 y.o. female who presents to Urgent Care today for coccyx pain. Patient fell yesterday evening landing on her elbows and coccyx. She has pain and tenderness. This is worse with sitting and activity. She's tried ice which helps some. She feels well otherwise except for her coccyx pain. She prefers to take Tylenol which is helped a bit. She is walking normally.   Past Medical History  Diagnosis Date  . Hypertension   . High cholesterol   . Stroke   . Heart murmur   . Osteoporosis   . Panic attack   . Thyroid disease    History  Substance Use Topics  . Smoking status: Never Smoker   . Smokeless tobacco: Never Used  . Alcohol Use: No   ROS as above Medications reviewed. No current facility-administered medications for this encounter.   Current Outpatient Prescriptions  Medication Sig Dispense Refill  . Calcium Carbonate-Vitamin D (CALCIUM 600 + D PO) Take 1 tablet by mouth 2 (two) times daily.      Marland Kitchen dipyridamole-aspirin (AGGRENOX) 200-25 MG per 12 hr capsule Take 1 capsule by mouth 2 (two) times daily.      . fexofenadine (ALLEGRA) 180 MG tablet Take 90 mg by mouth daily.      Marland Kitchen levothyroxine (SYNTHROID, LEVOTHROID) 50 MCG tablet Take 50 mcg by mouth daily before breakfast.      . lisinopril (PRINIVIL,ZESTRIL) 20 MG tablet Take 1 tablet (20 mg total) by mouth daily.  30 tablet  0  . Multiple Vitamin (MULTIVITAMIN WITH MINERALS) TABS tablet Take 1 tablet by mouth daily.      . risperiDONE (RISPERDAL M-TABS) 0.5 MG disintegrating tablet Take 1 tablet (0.5 mg total) by mouth 2 (two) times daily.      Marland Kitchen alendronate (FOSAMAX) 70 MG tablet Take 70 mg by mouth every 7 (seven) days. Take with a full glass of water on an empty stomach.      . CRANBERRY PO Take 500 mg by mouth daily.      Marland Kitchen ESTRACE VAGINAL 0.1 MG/GM vaginal cream Place 2 g vaginally daily.       Marland Kitchen estradiol (ESTRACE) 1 MG tablet Take 1 mg by mouth every Monday, Wednesday, and Friday. Takes at bedtime  only.      Marland Kitchen FLUoxetine (PROZAC) 10 MG capsule Take 1 capsule (10 mg total) by mouth daily.  30 capsule  3  . fluticasone (FLONASE) 50 MCG/ACT nasal spray Place 2 sprays into the nose daily.      . hydrocortisone (ANUSOL-HC) 2.5 % rectal cream Place rectally 4 (four) times daily.  30 g  0  . polyethylene glycol (MIRALAX / GLYCOLAX) packet Take 17 g by mouth daily as needed.  14 each  0  . senna-docusate (SENOKOT-S) 8.6-50 MG per tablet Take 2 tablets by mouth 2 (two) times daily.      . simvastatin (ZOCOR) 10 MG tablet Take 10 mg by mouth at bedtime.      Marland Kitchen UNABLE TO FIND Take 5 mLs by mouth daily. Med Name: flax mill supplement        Exam:  BP 203/88  Pulse 72  Temp(Src) 98.1 F (36.7 C) (Oral)  Resp 18  SpO2 98% Gen: Well NAD COCCYX: Tender to palpation Elbows bilaterally nontender normal motion.   Normal gait.   No results found for this or any previous visit (from the past 24 hour(s)). Dg Sacrum/coccyx  03/26/2013   CLINICAL DATA:  Fall.  Pain.  EXAM: SACRUM AND COCCYX - 2+ VIEW  COMPARISON:  02/19/2013.  FINDINGS: Prominent degenerative changes noted lumbosacral spine. No evidence of fracture. Presacral soft tissues appear normal. Visualized hips appear normal.  IMPRESSION: Degenerative changes lumbosacral spine.  No acute abnormality.   Electronically Signed   By: Maisie Fus  Register   On: 03/26/2013 17:40    Assessment and Plan: 72 y.o. female with coccydynia following a fall.  No fracture. Patient is moving and walking normally. She prefers to take Tylenol. Will use ice cushions Tylenol and followup with primary care provider. Additionally recommend followup for blood pressure management. Discussed warning signs or symptoms. Please see discharge instructions. Patient expresses understanding.      Rodolph Bong, MD 03/26/13 1754

## 2015-07-03 DIAGNOSIS — H61009 Unspecified perichondritis of external ear, unspecified ear: Secondary | ICD-10-CM | POA: Diagnosis not present

## 2015-07-03 DIAGNOSIS — C44612 Basal cell carcinoma of skin of right upper limb, including shoulder: Secondary | ICD-10-CM | POA: Diagnosis not present

## 2015-07-03 DIAGNOSIS — H61011 Acute perichondritis of right external ear: Secondary | ICD-10-CM | POA: Diagnosis not present

## 2015-07-03 DIAGNOSIS — L280 Lichen simplex chronicus: Secondary | ICD-10-CM | POA: Diagnosis not present

## 2015-07-10 DIAGNOSIS — F411 Generalized anxiety disorder: Secondary | ICD-10-CM | POA: Diagnosis not present

## 2015-07-10 DIAGNOSIS — F4310 Post-traumatic stress disorder, unspecified: Secondary | ICD-10-CM | POA: Diagnosis not present

## 2015-07-10 DIAGNOSIS — F315 Bipolar disorder, current episode depressed, severe, with psychotic features: Secondary | ICD-10-CM | POA: Diagnosis not present

## 2015-07-16 DIAGNOSIS — E039 Hypothyroidism, unspecified: Secondary | ICD-10-CM | POA: Diagnosis not present

## 2015-07-16 DIAGNOSIS — Z8673 Personal history of transient ischemic attack (TIA), and cerebral infarction without residual deficits: Secondary | ICD-10-CM | POA: Diagnosis not present

## 2015-07-16 DIAGNOSIS — F319 Bipolar disorder, unspecified: Secondary | ICD-10-CM | POA: Diagnosis not present

## 2015-07-16 DIAGNOSIS — Z1389 Encounter for screening for other disorder: Secondary | ICD-10-CM | POA: Diagnosis not present

## 2015-07-16 DIAGNOSIS — M810 Age-related osteoporosis without current pathological fracture: Secondary | ICD-10-CM | POA: Diagnosis not present

## 2015-07-16 DIAGNOSIS — I10 Essential (primary) hypertension: Secondary | ICD-10-CM | POA: Diagnosis not present

## 2015-07-16 DIAGNOSIS — E785 Hyperlipidemia, unspecified: Secondary | ICD-10-CM | POA: Diagnosis not present

## 2015-07-16 DIAGNOSIS — Z0000 Encounter for general adult medical examination without abnormal findings: Secondary | ICD-10-CM | POA: Diagnosis not present

## 2015-08-07 DIAGNOSIS — H61009 Unspecified perichondritis of external ear, unspecified ear: Secondary | ICD-10-CM | POA: Diagnosis not present

## 2015-08-07 DIAGNOSIS — L821 Other seborrheic keratosis: Secondary | ICD-10-CM | POA: Diagnosis not present

## 2015-08-07 DIAGNOSIS — Z08 Encounter for follow-up examination after completed treatment for malignant neoplasm: Secondary | ICD-10-CM | POA: Diagnosis not present

## 2015-08-07 DIAGNOSIS — Z85828 Personal history of other malignant neoplasm of skin: Secondary | ICD-10-CM | POA: Diagnosis not present

## 2015-11-13 DIAGNOSIS — F4310 Post-traumatic stress disorder, unspecified: Secondary | ICD-10-CM | POA: Diagnosis not present

## 2015-11-13 DIAGNOSIS — F315 Bipolar disorder, current episode depressed, severe, with psychotic features: Secondary | ICD-10-CM | POA: Diagnosis not present

## 2015-11-13 DIAGNOSIS — F411 Generalized anxiety disorder: Secondary | ICD-10-CM | POA: Diagnosis not present

## 2016-01-18 DIAGNOSIS — F319 Bipolar disorder, unspecified: Secondary | ICD-10-CM | POA: Diagnosis not present

## 2016-01-18 DIAGNOSIS — Z23 Encounter for immunization: Secondary | ICD-10-CM | POA: Diagnosis not present

## 2016-01-18 DIAGNOSIS — I10 Essential (primary) hypertension: Secondary | ICD-10-CM | POA: Diagnosis not present

## 2016-01-18 DIAGNOSIS — M810 Age-related osteoporosis without current pathological fracture: Secondary | ICD-10-CM | POA: Diagnosis not present

## 2016-01-18 DIAGNOSIS — E785 Hyperlipidemia, unspecified: Secondary | ICD-10-CM | POA: Diagnosis not present

## 2016-01-18 DIAGNOSIS — E039 Hypothyroidism, unspecified: Secondary | ICD-10-CM | POA: Diagnosis not present

## 2016-01-18 DIAGNOSIS — Z8673 Personal history of transient ischemic attack (TIA), and cerebral infarction without residual deficits: Secondary | ICD-10-CM | POA: Diagnosis not present

## 2016-03-04 DIAGNOSIS — F315 Bipolar disorder, current episode depressed, severe, with psychotic features: Secondary | ICD-10-CM | POA: Diagnosis not present

## 2016-03-04 DIAGNOSIS — F4310 Post-traumatic stress disorder, unspecified: Secondary | ICD-10-CM | POA: Diagnosis not present

## 2016-03-04 DIAGNOSIS — F411 Generalized anxiety disorder: Secondary | ICD-10-CM | POA: Diagnosis not present

## 2016-03-24 DIAGNOSIS — M810 Age-related osteoporosis without current pathological fracture: Secondary | ICD-10-CM | POA: Diagnosis not present

## 2016-05-12 ENCOUNTER — Encounter: Payer: Self-pay | Admitting: Podiatry

## 2016-05-12 ENCOUNTER — Ambulatory Visit (INDEPENDENT_AMBULATORY_CARE_PROVIDER_SITE_OTHER): Payer: PPO | Admitting: Podiatry

## 2016-05-12 VITALS — BP 131/59 | HR 81 | Resp 14

## 2016-05-12 DIAGNOSIS — L603 Nail dystrophy: Secondary | ICD-10-CM

## 2016-05-12 DIAGNOSIS — M79674 Pain in right toe(s): Secondary | ICD-10-CM | POA: Diagnosis not present

## 2016-05-12 NOTE — Patient Instructions (Signed)

## 2016-05-13 ENCOUNTER — Ambulatory Visit: Payer: PPO | Admitting: Sports Medicine

## 2016-05-16 NOTE — Progress Notes (Signed)
   Subjective:  Patient presents today as a new patient for evaluation of right second toe pain. Patient states that she has a thick toenail that rubs in her shoes and is very tender. This is been ongoing for several months. No alleviating factors. Patient is able to ambulate and proper shoe gear without pain.    Objective/Physical Exam General: The patient is alert and oriented x3 in no acute distress.  Dermatology: Hardened, thickened, elongated toenail noted to the second digit right foot. Very tender to palpation. Skin is warm, dry and supple bilateral lower extremities. Negative for open lesions or macerations.  Vascular: Palpable pedal pulses bilaterally. No edema or erythema noted. Capillary refill within normal limits.  Neurological: Epicritic and protective threshold grossly intact bilaterally.   Musculoskeletal Exam: Range of motion within normal limits to all pedal and ankle joints bilateral. Muscle strength 5/5 in all groups bilateral.   Radiographic Exam:  Normal osseous mineralization. Joint spaces preserved. No fracture/dislocation/boney destruction.    Assessment: #1 onychodystrophic nail second digit right foot #2 pain in toe secondary to toenail right foot   Plan of Care:  #1 Patient was evaluated. #2 today we discussed conservative versus procedural management of the toenail to the second digit right foot. Conservative management includes debridement and trimming the nail to alleviate symptoms. More definitive management includes total permanent nail avulsion. Patient opts for total permanent nail avulsion to the second digit right foot. #3 local anesthesia infiltration was utilized to the second digit right foot consisting of 50-50 mixture of 2% lidocaine plain and 0.5% Marcaine plain. #4 total permanent toenail avulsion was performed to the second digit right foot with [id number] seconds applications of phenol to the nail matrix followed by alcohol flush. #5 dry sterile  dressing was applied. #6 return to clinic in 2 weeks for follow-up evaluation   Edrick Kins, DPM [facility]  Dr. Edrick Kins, [address]                                        [place], [place] [address]                Office [phone]  Fax [phone]

## 2016-05-26 ENCOUNTER — Encounter: Payer: Self-pay | Admitting: Podiatry

## 2016-05-26 ENCOUNTER — Ambulatory Visit (INDEPENDENT_AMBULATORY_CARE_PROVIDER_SITE_OTHER): Payer: PPO | Admitting: Podiatry

## 2016-05-26 DIAGNOSIS — S91209D Unspecified open wound of unspecified toe(s) with damage to nail, subsequent encounter: Secondary | ICD-10-CM

## 2016-05-26 DIAGNOSIS — M79676 Pain in unspecified toe(s): Secondary | ICD-10-CM | POA: Diagnosis not present

## 2016-05-26 DIAGNOSIS — S91109D Unspecified open wound of unspecified toe(s) without damage to nail, subsequent encounter: Secondary | ICD-10-CM

## 2016-06-06 NOTE — Progress Notes (Signed)
   Subjective: Patient presents today 2 weeks post total nail avulsion procedure. Patient states that the toe and nail fold is feeling much better.  Objective: Skin is warm, dry and supple. Nail bed and respective nail fold appears to be healing appropriately. Open wound to the associated nail fold with a granular wound base and moderate amount of fibrotic tissue. Minimal drainage noted. Mild erythema around the periungual region likely due to phenol chemical matricectomy.  Assessment: #1 postop permanent total nail avulsion second digit right foot #2 open wound periungual nail fold and nail bed of respective digit.   Plan of care: #1 patient was evaluated  #2 debridement of open wound was performed to the periungual border and nail fold of the respective toe using a currette. Antibiotic ointment and Band-Aid was applied. #3 patient is to return to clinic on a PRN  basis.   Nichole Foster, DPM [facility]  Dr. Edrick Foster, [address]                                        [address], [place] [address]                Office [phone]  Fax [phone]

## 2016-09-02 DIAGNOSIS — F4310 Post-traumatic stress disorder, unspecified: Secondary | ICD-10-CM | POA: Diagnosis not present

## 2016-09-02 DIAGNOSIS — F411 Generalized anxiety disorder: Secondary | ICD-10-CM | POA: Diagnosis not present

## 2016-09-02 DIAGNOSIS — F315 Bipolar disorder, current episode depressed, severe, with psychotic features: Secondary | ICD-10-CM | POA: Diagnosis not present

## 2016-09-08 DIAGNOSIS — M810 Age-related osteoporosis without current pathological fracture: Secondary | ICD-10-CM | POA: Diagnosis not present

## 2016-09-08 DIAGNOSIS — Z1211 Encounter for screening for malignant neoplasm of colon: Secondary | ICD-10-CM | POA: Diagnosis not present

## 2016-09-08 DIAGNOSIS — Z0000 Encounter for general adult medical examination without abnormal findings: Secondary | ICD-10-CM | POA: Diagnosis not present

## 2016-09-08 DIAGNOSIS — Z23 Encounter for immunization: Secondary | ICD-10-CM | POA: Diagnosis not present

## 2016-09-08 DIAGNOSIS — N183 Chronic kidney disease, stage 3 (moderate): Secondary | ICD-10-CM | POA: Diagnosis not present

## 2016-09-08 DIAGNOSIS — E039 Hypothyroidism, unspecified: Secondary | ICD-10-CM | POA: Diagnosis not present

## 2016-09-08 DIAGNOSIS — Z1389 Encounter for screening for other disorder: Secondary | ICD-10-CM | POA: Diagnosis not present

## 2016-09-08 DIAGNOSIS — Z8673 Personal history of transient ischemic attack (TIA), and cerebral infarction without residual deficits: Secondary | ICD-10-CM | POA: Diagnosis not present

## 2016-09-08 DIAGNOSIS — I129 Hypertensive chronic kidney disease with stage 1 through stage 4 chronic kidney disease, or unspecified chronic kidney disease: Secondary | ICD-10-CM | POA: Diagnosis not present

## 2016-09-08 DIAGNOSIS — E785 Hyperlipidemia, unspecified: Secondary | ICD-10-CM | POA: Diagnosis not present

## 2016-09-08 DIAGNOSIS — F319 Bipolar disorder, unspecified: Secondary | ICD-10-CM | POA: Diagnosis not present

## 2016-09-18 DIAGNOSIS — Z1231 Encounter for screening mammogram for malignant neoplasm of breast: Secondary | ICD-10-CM | POA: Diagnosis not present

## 2016-09-26 DIAGNOSIS — Z1211 Encounter for screening for malignant neoplasm of colon: Secondary | ICD-10-CM | POA: Diagnosis not present

## 2016-09-29 DIAGNOSIS — Z01419 Encounter for gynecological examination (general) (routine) without abnormal findings: Secondary | ICD-10-CM | POA: Diagnosis not present

## 2017-01-02 ENCOUNTER — Other Ambulatory Visit (HOSPITAL_COMMUNITY): Payer: Self-pay | Admitting: Family Medicine

## 2017-01-02 ENCOUNTER — Ambulatory Visit (HOSPITAL_COMMUNITY)
Admission: RE | Admit: 2017-01-02 | Discharge: 2017-01-02 | Disposition: A | Discharge status: Home / Self Care | Payer: PPO | Source: Ambulatory Visit | Attending: Family Medicine | Admitting: Family Medicine

## 2017-01-02 DIAGNOSIS — K1379 Other lesions of oral mucosa: Secondary | ICD-10-CM | POA: Diagnosis not present

## 2017-01-02 DIAGNOSIS — R609 Edema, unspecified: Secondary | ICD-10-CM

## 2017-01-02 DIAGNOSIS — R600 Localized edema: Secondary | ICD-10-CM | POA: Insufficient documentation

## 2017-01-02 NOTE — Progress Notes (Addendum)
*  PRELIMINARY RESULTS* Vascular Ultrasound Right lower extremity venous duplex has been completed.  Preliminary findings: No evidence of DVT or baker's cyst.   Called answering service at 5:20 pm. No call back from on call physician as of 5:32 pm, will let patient leave. Dr. Kenton Kingfisher returned page at 5:44pm.    Landry Mellow, RDMS, RVT  01/02/2017

## 2017-01-09 ENCOUNTER — Ambulatory Visit (INDEPENDENT_AMBULATORY_CARE_PROVIDER_SITE_OTHER): Payer: PPO

## 2017-01-09 ENCOUNTER — Encounter (HOSPITAL_COMMUNITY): Payer: Self-pay | Admitting: Emergency Medicine

## 2017-01-09 ENCOUNTER — Ambulatory Visit (HOSPITAL_COMMUNITY)
Admission: EM | Admit: 2017-01-09 | Discharge: 2017-01-09 | Disposition: A | Discharge status: Home / Self Care | Payer: PPO | Attending: Emergency Medicine | Admitting: Emergency Medicine

## 2017-01-09 DIAGNOSIS — M25552 Pain in left hip: Secondary | ICD-10-CM

## 2017-01-09 DIAGNOSIS — W19XXXA Unspecified fall, initial encounter: Secondary | ICD-10-CM | POA: Diagnosis not present

## 2017-01-09 DIAGNOSIS — S79912A Unspecified injury of left hip, initial encounter: Secondary | ICD-10-CM | POA: Diagnosis not present

## 2017-01-09 DIAGNOSIS — R0781 Pleurodynia: Secondary | ICD-10-CM | POA: Diagnosis not present

## 2017-01-09 DIAGNOSIS — T148XXA Other injury of unspecified body region, initial encounter: Secondary | ICD-10-CM

## 2017-01-09 DIAGNOSIS — S20212A Contusion of left front wall of thorax, initial encounter: Secondary | ICD-10-CM

## 2017-01-09 DIAGNOSIS — M25559 Pain in unspecified hip: Secondary | ICD-10-CM

## 2017-01-09 DIAGNOSIS — S299XXA Unspecified injury of thorax, initial encounter: Secondary | ICD-10-CM | POA: Diagnosis not present

## 2017-01-09 DIAGNOSIS — S7002XA Contusion of left hip, initial encounter: Secondary | ICD-10-CM

## 2017-01-09 NOTE — ED Notes (Signed)
Patient assisted to restroom and into gown for imaging.

## 2017-01-09 NOTE — ED Provider Notes (Signed)
[facility]    CSN: [id number] Arrival date & time: 01/09/17  1629     History   Chief Complaint No chief complaint on file.   HPI EMOREE SASAKI is a 76 y.o. female.   76 year old female that Emily's with a cane and history of fall states that yesterday morning she fell primarily on her left side. She states she sore all over but her primary pain is in the left hip, lesser to the right and in the left ribs. Since the fall she has been ambulatory with her cane but with the pain described above. No shortness of breath or cough or hemoptysis. She states that she fell she bumped her head on the floor but no loss of consciousness. No vomiting. No change in mentation. Denies pain to the chest, back or abdomen. Denies known injury to the upper extremities.      Past Medical History:  Diagnosis Date  . Heart murmur   . High cholesterol   . Hypertension   . Osteoporosis   . Panic attack   . Stroke ([place])   . Thyroid disease     Patient Active Problem List   Diagnosis Date Noted  . Thyroid disease   . Hypertension   . High cholesterol   . Hyponatremia 02/12/2013  . Acute encephalopathy 02/12/2013  . Osteoporosis   . Panic attack     Past Surgical History:  Procedure Laterality Date  . CATARACT EXTRACTION      OB History    No data available       Home Medications    Prior to Admission medications   Medication Sig Start Date End Date Taking? Authorizing Provider  alendronate (FOSAMAX) 70 MG tablet Take 70 mg by mouth every 7 (seven) days. Take with a full glass of water on an empty stomach.   Yes [provider]  amLODipine (NORVASC) 10 MG tablet  04/24/16  Yes [provider]  Calcium Carbonate-Vitamin D (CALCIUM 600 + D PO) Take 1 tablet by mouth 2 (two) times daily.   Yes [provider]  CRANBERRY PO Take 500 mg by mouth daily.   Yes [provider]  dipyridamole-aspirin (AGGRENOX) 200-25 MG per 12 hr capsule Take  1 capsule by mouth 2 (two) times daily.   Yes [provider]  ESTRACE VAGINAL 0.1 MG/GM vaginal cream Place 2 g vaginally daily.  02/02/13  Yes [provider]  estradiol (ESTRACE) 1 MG tablet Take 1 mg by mouth every Monday, Wednesday, and Friday. Takes at bedtime only.   Yes [provider]  fexofenadine (ALLEGRA) 180 MG tablet Take 90 mg by mouth daily.   Yes [provider]  FLUoxetine (PROZAC) 10 MG capsule Take 1 capsule (10 mg total) by mouth daily. 02/28/13  Yes Hosie Poisson, MD  fluticasone (FLONASE) 50 MCG/ACT nasal spray Place 2 sprays into the nose daily.   Yes [provider]  hydrocortisone (ANUSOL-HC) 2.5 % rectal cream Place rectally 4 (four) times daily. 02/28/13  Yes Hosie Poisson, MD  IRON PO Take by mouth.   Yes [provider]  lamoTRIgine (LAMICTAL) 25 MG tablet  05/09/16  Yes [provider]  levothyroxine (SYNTHROID, LEVOTHROID) 50 MCG tablet Take 50 mcg by mouth daily before breakfast.   Yes [provider]  lisinopril (PRINIVIL,ZESTRIL) 20 MG tablet Take 1 tablet (20 mg total) by mouth daily. 02/13/13  Yes Verlee Monte, MD  Multiple Vitamin (MULTIVITAMIN WITH MINERALS) TABS tablet Take 1  tablet by mouth daily.   Yes [provider]  Omega-3 Fatty Acids (FISH OIL) 1200 MG CAPS Take by mouth.   Yes [provider]  polyethylene glycol (MIRALAX / GLYCOLAX) packet Take 17 g by mouth daily as needed. 02/28/13  Yes Hosie Poisson, MD  QUEtiapine (SEROQUEL) 100 MG tablet  04/22/16  Yes [provider]  risperiDONE (RISPERDAL M-TABS) 0.5 MG disintegrating tablet Take 1 tablet (0.5 mg total) by mouth 2 (two) times daily. 02/28/13  Yes Hosie Poisson, MD  senna-docusate (SENOKOT-S) 8.6-50 MG per tablet Take 2 tablets by mouth 2 (two) times daily. 02/28/13  Yes Hosie Poisson, MD  simvastatin (ZOCOR) 10 MG tablet Take 10 mg by mouth at bedtime.   Yes [provider]  UNABLE TO FIND  Take 5 mLs by mouth daily. Med Name: flax mill supplement   Yes [provider]    Family History History reviewed. No pertinent family history.  Social History Social History  Substance Use Topics  . Smoking status: Never Smoker  . Smokeless tobacco: Never Used  . Alcohol use No     Allergies   Sulfa antibiotics and Latex   Review of Systems Review of Systems  Constitutional: Negative for activity change, chills and fever.  HENT: Negative.   Respiratory: Negative.   Cardiovascular: Negative.   Gastrointestinal: Negative for abdominal pain and vomiting.  Musculoskeletal: Positive for myalgias.       As per HPI  Skin: Negative for color change, pallor and rash.  Neurological: Negative.  Negative for syncope, light-headedness and headaches.  All other systems reviewed and are negative.    Physical Exam Triage Vital Signs ED Triage Vitals  Enc Vitals Group     BP 01/09/17 1748 (!) 160/54     Pulse Rate 01/09/17 1748 64     Resp 01/09/17 1748 16     Temp 01/09/17 1748 98.2 F (36.8 C)     Temp Source 01/09/17 1748 Oral     SpO2 01/09/17 1748 100 %     Weight --      Height --      Head Circumference --      Peak Flow --      Pain Score 01/09/17 1752 8     Pain Loc --      Pain Edu? --      Excl. in [place]? --    No data found.   Updated Vital Signs BP (!) 160/54 (BP Location: Left Arm) Comment: notified RM  Pulse 64   Temp 98.2 F (36.8 C) (Oral)   Resp 16   SpO2 100%   Visual Acuity Right Eye Distance:   Left Eye Distance:   Bilateral Distance:    Right Eye Near:   Left Eye Near:    Bilateral Near:     Physical Exam  Constitutional: She is oriented to person, place, and time. She appears well-developed and well-nourished. No distress.  HENT:  Head: Normocephalic and atraumatic.  Eyes: EOM are normal.  Neck: Normal range of motion. Neck supple.  Cardiovascular: Normal rate.   Pulmonary/Chest: Effort normal and breath sounds normal. She  has no wheezes.  Tenderness to the left posterior lower ribs.  Musculoskeletal:  Tenderness to the left lateral hip and proximal femur. Tenderness primarily to the soft tissue. A little to any bruising can be seen. No deformity, no asymmetry. Patient able to perform a straight leg raise about 6 inches. Rotation externally and internally intact but limited in  degrees. Distal neurovascular motor sensory grossly intact. Right hip with good range of motion, ability to perform straight leg raise. Minor soft tissue and tenderness to the right lateral hip.  Neurological: She is alert and oriented to person, place, and time. No cranial nerve deficit.  Skin: Skin is warm and dry.  Nursing note and vitals reviewed.    UC Treatments / Results  Labs (all labs ordered are listed, but only abnormal results are displayed) Labs Reviewed - No data to display  EKG  EKG Interpretation None       Radiology Dg Ribs Unilateral W/chest Left  Result Date: 01/09/2017 CLINICAL DATA:  Pain following fall EXAM: LEFT RIBS AND CHEST - 3+ VIEW COMPARISON:  Chest radiograph February 18, 2013 FINDINGS: Frontal chest as well as oblique and cone-down rib images obtained. Lungs are clear. Heart size and pulmonary vascularity are normal. No adenopathy. There is no pleural effusion or pneumothorax. No rib fracture evident. IMPRESSION: No demonstrable rib fracture.  No edema or consolidation. Electronically Signed   By: Lowella Grip III M.D.   On: 01/09/2017 20:16   Dg Hip Unilat With Pelvis 2-3 Views Left  Result Date: 01/09/2017 CLINICAL DATA:  Fall onto left hip. EXAM: DG HIP (WITH OR WITHOUT PELVIS) 2-3V LEFT COMPARISON:  None. FINDINGS: There is no hip fracture or dislocation. There is no arthropathy or other focal bone abnormality. IMPRESSION: No acute fracture or dislocation of the left hip. Electronically Signed   By: Ulyses Jarred M.D.   On: 01/09/2017 20:15    Procedures Procedures (including critical care  time)  Medications Ordered in UC Medications - No data to display   Initial Impression / Assessment and Plan / UC Course  I have reviewed the triage vital signs and the nursing notes.  Pertinent labs & imaging results that were available during my care of the patient were reviewed by me and considered in my medical decision making (see chart for details).     Ice to the hip and ribs off and on for the next couple days. Then may go to heat. Tylenol every 4 hours as needed for pain. May use ibuprofen 200 mg every 6 hours as needed for pain. Follow-up with your primary care doctor next week as needed.   Final Clinical Impressions(s) / UC Diagnoses   Final diagnoses:  Pain, hip  Fall, initial encounter  Contusion of left hip, initial encounter  Rib contusion, left, initial encounter  Myalgia, traumatic    New Prescriptions New Prescriptions   No medications on file     Controlled Substance Prescriptions [place] Controlled Substance Registry consulted? Not Applicable   Janne Napoleon, NP 01/09/17 2050

## 2017-01-09 NOTE — ED Notes (Signed)
Patient assisted to get dress

## 2017-01-09 NOTE — Discharge Instructions (Signed)
Ice to the hip and ribs off and on for the next couple days. Then may go to heat. Tylenol every 4 hours as needed for pain. May use ibuprofen 200 mg every 6 hours as needed for pain. Follow-up with your primary care doctor next week as needed.

## 2017-01-09 NOTE — ED Triage Notes (Signed)
Pt reports she fell today while in her kitchen around [place]... sts she inj her left hip and also c/o right hip pain  Denies head inj/LOC  Brought back on wheel chair... A&O x4... NAD.

## 2017-01-28 DIAGNOSIS — J0100 Acute maxillary sinusitis, unspecified: Secondary | ICD-10-CM | POA: Diagnosis not present

## 2017-02-12 DIAGNOSIS — J329 Chronic sinusitis, unspecified: Secondary | ICD-10-CM | POA: Diagnosis not present

## 2017-02-12 DIAGNOSIS — R110 Nausea: Secondary | ICD-10-CM | POA: Diagnosis not present

## 2017-02-12 DIAGNOSIS — R0982 Postnasal drip: Secondary | ICD-10-CM | POA: Diagnosis not present

## 2017-02-12 DIAGNOSIS — J309 Allergic rhinitis, unspecified: Secondary | ICD-10-CM | POA: Diagnosis not present

## 2017-03-02 DIAGNOSIS — F315 Bipolar disorder, current episode depressed, severe, with psychotic features: Secondary | ICD-10-CM | POA: Diagnosis not present

## 2017-03-02 DIAGNOSIS — F4310 Post-traumatic stress disorder, unspecified: Secondary | ICD-10-CM | POA: Diagnosis not present

## 2017-03-02 DIAGNOSIS — F411 Generalized anxiety disorder: Secondary | ICD-10-CM | POA: Diagnosis not present

## 2017-03-12 DIAGNOSIS — Z23 Encounter for immunization: Secondary | ICD-10-CM | POA: Diagnosis not present

## 2017-04-02 DIAGNOSIS — F411 Generalized anxiety disorder: Secondary | ICD-10-CM | POA: Diagnosis not present

## 2017-04-02 DIAGNOSIS — F4310 Post-traumatic stress disorder, unspecified: Secondary | ICD-10-CM | POA: Diagnosis not present

## 2017-04-02 DIAGNOSIS — F315 Bipolar disorder, current episode depressed, severe, with psychotic features: Secondary | ICD-10-CM | POA: Diagnosis not present

## 2017-04-13 DIAGNOSIS — Z8673 Personal history of transient ischemic attack (TIA), and cerebral infarction without residual deficits: Secondary | ICD-10-CM | POA: Diagnosis not present

## 2017-04-13 DIAGNOSIS — E785 Hyperlipidemia, unspecified: Secondary | ICD-10-CM | POA: Diagnosis not present

## 2017-04-13 DIAGNOSIS — E039 Hypothyroidism, unspecified: Secondary | ICD-10-CM | POA: Diagnosis not present

## 2017-04-13 DIAGNOSIS — I10 Essential (primary) hypertension: Secondary | ICD-10-CM | POA: Diagnosis not present

## 2017-08-06 DIAGNOSIS — F315 Bipolar disorder, current episode depressed, severe, with psychotic features: Secondary | ICD-10-CM | POA: Diagnosis not present

## 2017-08-06 DIAGNOSIS — F411 Generalized anxiety disorder: Secondary | ICD-10-CM | POA: Diagnosis not present

## 2017-08-06 DIAGNOSIS — F4310 Post-traumatic stress disorder, unspecified: Secondary | ICD-10-CM | POA: Diagnosis not present

## 2017-09-28 DIAGNOSIS — Z8673 Personal history of transient ischemic attack (TIA), and cerebral infarction without residual deficits: Secondary | ICD-10-CM | POA: Diagnosis not present

## 2017-09-28 DIAGNOSIS — M810 Age-related osteoporosis without current pathological fracture: Secondary | ICD-10-CM | POA: Diagnosis not present

## 2017-09-28 DIAGNOSIS — E039 Hypothyroidism, unspecified: Secondary | ICD-10-CM | POA: Diagnosis not present

## 2017-09-28 DIAGNOSIS — Z1389 Encounter for screening for other disorder: Secondary | ICD-10-CM | POA: Diagnosis not present

## 2017-09-28 DIAGNOSIS — F319 Bipolar disorder, unspecified: Secondary | ICD-10-CM | POA: Diagnosis not present

## 2017-09-28 DIAGNOSIS — I129 Hypertensive chronic kidney disease with stage 1 through stage 4 chronic kidney disease, or unspecified chronic kidney disease: Secondary | ICD-10-CM | POA: Diagnosis not present

## 2017-09-28 DIAGNOSIS — N183 Chronic kidney disease, stage 3 (moderate): Secondary | ICD-10-CM | POA: Diagnosis not present

## 2017-09-28 DIAGNOSIS — Z0000 Encounter for general adult medical examination without abnormal findings: Secondary | ICD-10-CM | POA: Diagnosis not present

## 2017-09-28 DIAGNOSIS — E785 Hyperlipidemia, unspecified: Secondary | ICD-10-CM | POA: Diagnosis not present

## 2017-09-28 DIAGNOSIS — E440 Moderate protein-calorie malnutrition: Secondary | ICD-10-CM | POA: Diagnosis not present

## 2017-10-06 DIAGNOSIS — Z01419 Encounter for gynecological examination (general) (routine) without abnormal findings: Secondary | ICD-10-CM | POA: Diagnosis not present

## 2017-10-06 DIAGNOSIS — Z1231 Encounter for screening mammogram for malignant neoplasm of breast: Secondary | ICD-10-CM | POA: Diagnosis not present

## 2017-10-12 DIAGNOSIS — E871 Hypo-osmolality and hyponatremia: Secondary | ICD-10-CM | POA: Diagnosis not present

## 2017-11-19 DIAGNOSIS — E870 Hyperosmolality and hypernatremia: Secondary | ICD-10-CM | POA: Diagnosis not present

## 2017-11-30 DIAGNOSIS — F315 Bipolar disorder, current episode depressed, severe, with psychotic features: Secondary | ICD-10-CM | POA: Diagnosis not present

## 2017-11-30 DIAGNOSIS — F4310 Post-traumatic stress disorder, unspecified: Secondary | ICD-10-CM | POA: Diagnosis not present

## 2017-11-30 DIAGNOSIS — F411 Generalized anxiety disorder: Secondary | ICD-10-CM | POA: Diagnosis not present

## 2018-03-15 DIAGNOSIS — Z8262 Family history of osteoporosis: Secondary | ICD-10-CM | POA: Diagnosis not present

## 2018-03-15 DIAGNOSIS — M810 Age-related osteoporosis without current pathological fracture: Secondary | ICD-10-CM | POA: Diagnosis not present

## 2018-03-30 DIAGNOSIS — E039 Hypothyroidism, unspecified: Secondary | ICD-10-CM | POA: Diagnosis not present

## 2018-03-30 DIAGNOSIS — I10 Essential (primary) hypertension: Secondary | ICD-10-CM | POA: Diagnosis not present

## 2018-03-30 DIAGNOSIS — Z23 Encounter for immunization: Secondary | ICD-10-CM | POA: Diagnosis not present

## 2018-03-30 DIAGNOSIS — E785 Hyperlipidemia, unspecified: Secondary | ICD-10-CM | POA: Diagnosis not present

## 2018-04-05 DIAGNOSIS — F4310 Post-traumatic stress disorder, unspecified: Secondary | ICD-10-CM | POA: Diagnosis not present

## 2018-04-05 DIAGNOSIS — F315 Bipolar disorder, current episode depressed, severe, with psychotic features: Secondary | ICD-10-CM | POA: Diagnosis not present

## 2018-04-05 DIAGNOSIS — F411 Generalized anxiety disorder: Secondary | ICD-10-CM | POA: Diagnosis not present

## 2018-10-04 DIAGNOSIS — Z0000 Encounter for general adult medical examination without abnormal findings: Secondary | ICD-10-CM | POA: Diagnosis not present

## 2018-10-04 DIAGNOSIS — E785 Hyperlipidemia, unspecified: Secondary | ICD-10-CM | POA: Diagnosis not present

## 2018-10-04 DIAGNOSIS — N183 Chronic kidney disease, stage 3 (moderate): Secondary | ICD-10-CM | POA: Diagnosis not present

## 2018-10-04 DIAGNOSIS — E039 Hypothyroidism, unspecified: Secondary | ICD-10-CM | POA: Diagnosis not present

## 2018-10-04 DIAGNOSIS — F319 Bipolar disorder, unspecified: Secondary | ICD-10-CM | POA: Diagnosis not present

## 2018-10-04 DIAGNOSIS — M810 Age-related osteoporosis without current pathological fracture: Secondary | ICD-10-CM | POA: Diagnosis not present

## 2018-10-04 DIAGNOSIS — I129 Hypertensive chronic kidney disease with stage 1 through stage 4 chronic kidney disease, or unspecified chronic kidney disease: Secondary | ICD-10-CM | POA: Diagnosis not present

## 2018-10-04 DIAGNOSIS — Z1389 Encounter for screening for other disorder: Secondary | ICD-10-CM | POA: Diagnosis not present

## 2018-10-04 DIAGNOSIS — Z8673 Personal history of transient ischemic attack (TIA), and cerebral infarction without residual deficits: Secondary | ICD-10-CM | POA: Diagnosis not present

## 2018-10-19 DIAGNOSIS — M810 Age-related osteoporosis without current pathological fracture: Secondary | ICD-10-CM | POA: Diagnosis not present

## 2018-10-19 DIAGNOSIS — E785 Hyperlipidemia, unspecified: Secondary | ICD-10-CM | POA: Diagnosis not present

## 2018-10-19 DIAGNOSIS — E039 Hypothyroidism, unspecified: Secondary | ICD-10-CM | POA: Diagnosis not present

## 2018-10-19 DIAGNOSIS — N183 Chronic kidney disease, stage 3 (moderate): Secondary | ICD-10-CM | POA: Diagnosis not present

## 2018-10-19 DIAGNOSIS — I129 Hypertensive chronic kidney disease with stage 1 through stage 4 chronic kidney disease, or unspecified chronic kidney disease: Secondary | ICD-10-CM | POA: Diagnosis not present

## 2018-10-19 DIAGNOSIS — D649 Anemia, unspecified: Secondary | ICD-10-CM | POA: Diagnosis not present

## 2018-11-11 DIAGNOSIS — M810 Age-related osteoporosis without current pathological fracture: Secondary | ICD-10-CM | POA: Diagnosis not present

## 2018-11-11 DIAGNOSIS — E785 Hyperlipidemia, unspecified: Secondary | ICD-10-CM | POA: Diagnosis not present

## 2018-11-11 DIAGNOSIS — N183 Chronic kidney disease, stage 3 (moderate): Secondary | ICD-10-CM | POA: Diagnosis not present

## 2018-11-11 DIAGNOSIS — E039 Hypothyroidism, unspecified: Secondary | ICD-10-CM | POA: Diagnosis not present

## 2018-11-11 DIAGNOSIS — I129 Hypertensive chronic kidney disease with stage 1 through stage 4 chronic kidney disease, or unspecified chronic kidney disease: Secondary | ICD-10-CM | POA: Diagnosis not present

## 2018-11-11 DIAGNOSIS — D649 Anemia, unspecified: Secondary | ICD-10-CM | POA: Diagnosis not present

## 2018-12-17 DIAGNOSIS — E039 Hypothyroidism, unspecified: Secondary | ICD-10-CM | POA: Diagnosis not present

## 2018-12-17 DIAGNOSIS — E785 Hyperlipidemia, unspecified: Secondary | ICD-10-CM | POA: Diagnosis not present

## 2018-12-17 DIAGNOSIS — I129 Hypertensive chronic kidney disease with stage 1 through stage 4 chronic kidney disease, or unspecified chronic kidney disease: Secondary | ICD-10-CM | POA: Diagnosis not present

## 2018-12-17 DIAGNOSIS — D649 Anemia, unspecified: Secondary | ICD-10-CM | POA: Diagnosis not present

## 2018-12-17 DIAGNOSIS — M810 Age-related osteoporosis without current pathological fracture: Secondary | ICD-10-CM | POA: Diagnosis not present

## 2018-12-17 DIAGNOSIS — N183 Chronic kidney disease, stage 3 (moderate): Secondary | ICD-10-CM | POA: Diagnosis not present

## 2019-01-19 DIAGNOSIS — F315 Bipolar disorder, current episode depressed, severe, with psychotic features: Secondary | ICD-10-CM | POA: Diagnosis not present

## 2019-01-19 DIAGNOSIS — F4310 Post-traumatic stress disorder, unspecified: Secondary | ICD-10-CM | POA: Diagnosis not present

## 2019-01-19 DIAGNOSIS — F411 Generalized anxiety disorder: Secondary | ICD-10-CM | POA: Diagnosis not present

## 2019-02-03 DIAGNOSIS — E039 Hypothyroidism, unspecified: Secondary | ICD-10-CM | POA: Diagnosis not present

## 2019-02-03 DIAGNOSIS — I129 Hypertensive chronic kidney disease with stage 1 through stage 4 chronic kidney disease, or unspecified chronic kidney disease: Secondary | ICD-10-CM | POA: Diagnosis not present

## 2019-02-03 DIAGNOSIS — D649 Anemia, unspecified: Secondary | ICD-10-CM | POA: Diagnosis not present

## 2019-02-03 DIAGNOSIS — M810 Age-related osteoporosis without current pathological fracture: Secondary | ICD-10-CM | POA: Diagnosis not present

## 2019-02-03 DIAGNOSIS — N183 Chronic kidney disease, stage 3 unspecified: Secondary | ICD-10-CM | POA: Diagnosis not present

## 2019-02-03 DIAGNOSIS — E785 Hyperlipidemia, unspecified: Secondary | ICD-10-CM | POA: Diagnosis not present

## 2019-04-05 DIAGNOSIS — E039 Hypothyroidism, unspecified: Secondary | ICD-10-CM | POA: Diagnosis not present

## 2019-04-05 DIAGNOSIS — N1831 Chronic kidney disease, stage 3a: Secondary | ICD-10-CM | POA: Diagnosis not present

## 2019-04-05 DIAGNOSIS — I129 Hypertensive chronic kidney disease with stage 1 through stage 4 chronic kidney disease, or unspecified chronic kidney disease: Secondary | ICD-10-CM | POA: Diagnosis not present

## 2019-04-05 DIAGNOSIS — E785 Hyperlipidemia, unspecified: Secondary | ICD-10-CM | POA: Diagnosis not present

## 2019-04-17 DIAGNOSIS — D649 Anemia, unspecified: Secondary | ICD-10-CM | POA: Diagnosis not present

## 2019-04-17 DIAGNOSIS — M810 Age-related osteoporosis without current pathological fracture: Secondary | ICD-10-CM | POA: Diagnosis not present

## 2019-04-17 DIAGNOSIS — E785 Hyperlipidemia, unspecified: Secondary | ICD-10-CM | POA: Diagnosis not present

## 2019-04-17 DIAGNOSIS — I129 Hypertensive chronic kidney disease with stage 1 through stage 4 chronic kidney disease, or unspecified chronic kidney disease: Secondary | ICD-10-CM | POA: Diagnosis not present

## 2019-04-17 DIAGNOSIS — E039 Hypothyroidism, unspecified: Secondary | ICD-10-CM | POA: Diagnosis not present

## 2019-05-17 DIAGNOSIS — F4310 Post-traumatic stress disorder, unspecified: Secondary | ICD-10-CM | POA: Diagnosis not present

## 2019-05-17 DIAGNOSIS — F411 Generalized anxiety disorder: Secondary | ICD-10-CM | POA: Diagnosis not present

## 2019-05-17 DIAGNOSIS — F315 Bipolar disorder, current episode depressed, severe, with psychotic features: Secondary | ICD-10-CM | POA: Diagnosis not present

## 2019-05-26 DIAGNOSIS — D649 Anemia, unspecified: Secondary | ICD-10-CM | POA: Diagnosis not present

## 2019-05-26 DIAGNOSIS — I129 Hypertensive chronic kidney disease with stage 1 through stage 4 chronic kidney disease, or unspecified chronic kidney disease: Secondary | ICD-10-CM | POA: Diagnosis not present

## 2019-05-26 DIAGNOSIS — M810 Age-related osteoporosis without current pathological fracture: Secondary | ICD-10-CM | POA: Diagnosis not present

## 2019-05-26 DIAGNOSIS — E785 Hyperlipidemia, unspecified: Secondary | ICD-10-CM | POA: Diagnosis not present

## 2019-05-26 DIAGNOSIS — E039 Hypothyroidism, unspecified: Secondary | ICD-10-CM | POA: Diagnosis not present

## 2019-06-15 DIAGNOSIS — M810 Age-related osteoporosis without current pathological fracture: Secondary | ICD-10-CM | POA: Diagnosis not present

## 2019-06-15 DIAGNOSIS — E785 Hyperlipidemia, unspecified: Secondary | ICD-10-CM | POA: Diagnosis not present

## 2019-06-15 DIAGNOSIS — D649 Anemia, unspecified: Secondary | ICD-10-CM | POA: Diagnosis not present

## 2019-06-15 DIAGNOSIS — I129 Hypertensive chronic kidney disease with stage 1 through stage 4 chronic kidney disease, or unspecified chronic kidney disease: Secondary | ICD-10-CM | POA: Diagnosis not present

## 2019-06-15 DIAGNOSIS — E039 Hypothyroidism, unspecified: Secondary | ICD-10-CM | POA: Diagnosis not present

## 2019-07-18 DIAGNOSIS — F319 Bipolar disorder, unspecified: Secondary | ICD-10-CM | POA: Diagnosis not present

## 2019-07-18 DIAGNOSIS — N1831 Chronic kidney disease, stage 3a: Secondary | ICD-10-CM | POA: Diagnosis not present

## 2019-07-18 DIAGNOSIS — D649 Anemia, unspecified: Secondary | ICD-10-CM | POA: Diagnosis not present

## 2019-07-18 DIAGNOSIS — E039 Hypothyroidism, unspecified: Secondary | ICD-10-CM | POA: Diagnosis not present

## 2019-07-18 DIAGNOSIS — I129 Hypertensive chronic kidney disease with stage 1 through stage 4 chronic kidney disease, or unspecified chronic kidney disease: Secondary | ICD-10-CM | POA: Diagnosis not present

## 2019-07-18 DIAGNOSIS — M810 Age-related osteoporosis without current pathological fracture: Secondary | ICD-10-CM | POA: Diagnosis not present

## 2019-07-18 DIAGNOSIS — E785 Hyperlipidemia, unspecified: Secondary | ICD-10-CM | POA: Diagnosis not present

## 2019-08-18 DIAGNOSIS — I129 Hypertensive chronic kidney disease with stage 1 through stage 4 chronic kidney disease, or unspecified chronic kidney disease: Secondary | ICD-10-CM | POA: Diagnosis not present

## 2019-08-18 DIAGNOSIS — N1831 Chronic kidney disease, stage 3a: Secondary | ICD-10-CM | POA: Diagnosis not present

## 2019-08-18 DIAGNOSIS — E039 Hypothyroidism, unspecified: Secondary | ICD-10-CM | POA: Diagnosis not present

## 2019-08-18 DIAGNOSIS — E785 Hyperlipidemia, unspecified: Secondary | ICD-10-CM | POA: Diagnosis not present

## 2019-08-18 DIAGNOSIS — M810 Age-related osteoporosis without current pathological fracture: Secondary | ICD-10-CM | POA: Diagnosis not present

## 2019-08-18 DIAGNOSIS — F319 Bipolar disorder, unspecified: Secondary | ICD-10-CM | POA: Diagnosis not present

## 2019-08-18 DIAGNOSIS — D649 Anemia, unspecified: Secondary | ICD-10-CM | POA: Diagnosis not present

## 2019-09-20 DIAGNOSIS — I129 Hypertensive chronic kidney disease with stage 1 through stage 4 chronic kidney disease, or unspecified chronic kidney disease: Secondary | ICD-10-CM | POA: Diagnosis not present

## 2019-09-20 DIAGNOSIS — N1831 Chronic kidney disease, stage 3a: Secondary | ICD-10-CM | POA: Diagnosis not present

## 2019-09-20 DIAGNOSIS — M810 Age-related osteoporosis without current pathological fracture: Secondary | ICD-10-CM | POA: Diagnosis not present

## 2019-09-20 DIAGNOSIS — D649 Anemia, unspecified: Secondary | ICD-10-CM | POA: Diagnosis not present

## 2019-09-20 DIAGNOSIS — F319 Bipolar disorder, unspecified: Secondary | ICD-10-CM | POA: Diagnosis not present

## 2019-09-20 DIAGNOSIS — E039 Hypothyroidism, unspecified: Secondary | ICD-10-CM | POA: Diagnosis not present

## 2019-09-20 DIAGNOSIS — E785 Hyperlipidemia, unspecified: Secondary | ICD-10-CM | POA: Diagnosis not present

## 2019-09-29 DIAGNOSIS — F315 Bipolar disorder, current episode depressed, severe, with psychotic features: Secondary | ICD-10-CM | POA: Diagnosis not present

## 2019-09-29 DIAGNOSIS — F4310 Post-traumatic stress disorder, unspecified: Secondary | ICD-10-CM | POA: Diagnosis not present

## 2019-09-29 DIAGNOSIS — F411 Generalized anxiety disorder: Secondary | ICD-10-CM | POA: Diagnosis not present

## 2019-10-05 DIAGNOSIS — M810 Age-related osteoporosis without current pathological fracture: Secondary | ICD-10-CM | POA: Diagnosis not present

## 2019-10-05 DIAGNOSIS — F319 Bipolar disorder, unspecified: Secondary | ICD-10-CM | POA: Diagnosis not present

## 2019-10-05 DIAGNOSIS — I129 Hypertensive chronic kidney disease with stage 1 through stage 4 chronic kidney disease, or unspecified chronic kidney disease: Secondary | ICD-10-CM | POA: Diagnosis not present

## 2019-10-05 DIAGNOSIS — Z0000 Encounter for general adult medical examination without abnormal findings: Secondary | ICD-10-CM | POA: Diagnosis not present

## 2019-10-05 DIAGNOSIS — E785 Hyperlipidemia, unspecified: Secondary | ICD-10-CM | POA: Diagnosis not present

## 2019-10-05 DIAGNOSIS — Z8673 Personal history of transient ischemic attack (TIA), and cerebral infarction without residual deficits: Secondary | ICD-10-CM | POA: Diagnosis not present

## 2019-10-05 DIAGNOSIS — L989 Disorder of the skin and subcutaneous tissue, unspecified: Secondary | ICD-10-CM | POA: Diagnosis not present

## 2019-10-05 DIAGNOSIS — Z1389 Encounter for screening for other disorder: Secondary | ICD-10-CM | POA: Diagnosis not present

## 2019-10-05 DIAGNOSIS — N1831 Chronic kidney disease, stage 3a: Secondary | ICD-10-CM | POA: Diagnosis not present

## 2019-10-05 DIAGNOSIS — E039 Hypothyroidism, unspecified: Secondary | ICD-10-CM | POA: Diagnosis not present

## 2019-10-18 DIAGNOSIS — E785 Hyperlipidemia, unspecified: Secondary | ICD-10-CM | POA: Diagnosis not present

## 2019-10-18 DIAGNOSIS — E039 Hypothyroidism, unspecified: Secondary | ICD-10-CM | POA: Diagnosis not present

## 2019-10-18 DIAGNOSIS — D649 Anemia, unspecified: Secondary | ICD-10-CM | POA: Diagnosis not present

## 2019-10-18 DIAGNOSIS — M810 Age-related osteoporosis without current pathological fracture: Secondary | ICD-10-CM | POA: Diagnosis not present

## 2019-10-18 DIAGNOSIS — N1831 Chronic kidney disease, stage 3a: Secondary | ICD-10-CM | POA: Diagnosis not present

## 2019-10-18 DIAGNOSIS — F319 Bipolar disorder, unspecified: Secondary | ICD-10-CM | POA: Diagnosis not present

## 2019-10-18 DIAGNOSIS — I129 Hypertensive chronic kidney disease with stage 1 through stage 4 chronic kidney disease, or unspecified chronic kidney disease: Secondary | ICD-10-CM | POA: Diagnosis not present

## 2019-10-18 DIAGNOSIS — L820 Inflamed seborrheic keratosis: Secondary | ICD-10-CM | POA: Diagnosis not present

## 2019-11-15 DIAGNOSIS — E785 Hyperlipidemia, unspecified: Secondary | ICD-10-CM | POA: Diagnosis not present

## 2019-11-15 DIAGNOSIS — D649 Anemia, unspecified: Secondary | ICD-10-CM | POA: Diagnosis not present

## 2019-11-15 DIAGNOSIS — M810 Age-related osteoporosis without current pathological fracture: Secondary | ICD-10-CM | POA: Diagnosis not present

## 2019-11-15 DIAGNOSIS — N1831 Chronic kidney disease, stage 3a: Secondary | ICD-10-CM | POA: Diagnosis not present

## 2019-11-15 DIAGNOSIS — I129 Hypertensive chronic kidney disease with stage 1 through stage 4 chronic kidney disease, or unspecified chronic kidney disease: Secondary | ICD-10-CM | POA: Diagnosis not present

## 2019-11-15 DIAGNOSIS — F319 Bipolar disorder, unspecified: Secondary | ICD-10-CM | POA: Diagnosis not present

## 2019-11-15 DIAGNOSIS — E039 Hypothyroidism, unspecified: Secondary | ICD-10-CM | POA: Diagnosis not present

## 2019-12-08 DIAGNOSIS — E785 Hyperlipidemia, unspecified: Secondary | ICD-10-CM | POA: Diagnosis not present

## 2019-12-08 DIAGNOSIS — I129 Hypertensive chronic kidney disease with stage 1 through stage 4 chronic kidney disease, or unspecified chronic kidney disease: Secondary | ICD-10-CM | POA: Diagnosis not present

## 2019-12-08 DIAGNOSIS — D649 Anemia, unspecified: Secondary | ICD-10-CM | POA: Diagnosis not present

## 2019-12-08 DIAGNOSIS — M810 Age-related osteoporosis without current pathological fracture: Secondary | ICD-10-CM | POA: Diagnosis not present

## 2019-12-08 DIAGNOSIS — N1831 Chronic kidney disease, stage 3a: Secondary | ICD-10-CM | POA: Diagnosis not present

## 2019-12-08 DIAGNOSIS — E039 Hypothyroidism, unspecified: Secondary | ICD-10-CM | POA: Diagnosis not present

## 2019-12-08 DIAGNOSIS — F319 Bipolar disorder, unspecified: Secondary | ICD-10-CM | POA: Diagnosis not present

## 2020-01-13 DIAGNOSIS — E039 Hypothyroidism, unspecified: Secondary | ICD-10-CM | POA: Diagnosis not present

## 2020-01-13 DIAGNOSIS — F319 Bipolar disorder, unspecified: Secondary | ICD-10-CM | POA: Diagnosis not present

## 2020-01-13 DIAGNOSIS — D649 Anemia, unspecified: Secondary | ICD-10-CM | POA: Diagnosis not present

## 2020-01-13 DIAGNOSIS — N1831 Chronic kidney disease, stage 3a: Secondary | ICD-10-CM | POA: Diagnosis not present

## 2020-01-13 DIAGNOSIS — E785 Hyperlipidemia, unspecified: Secondary | ICD-10-CM | POA: Diagnosis not present

## 2020-01-13 DIAGNOSIS — M810 Age-related osteoporosis without current pathological fracture: Secondary | ICD-10-CM | POA: Diagnosis not present

## 2020-01-13 DIAGNOSIS — I129 Hypertensive chronic kidney disease with stage 1 through stage 4 chronic kidney disease, or unspecified chronic kidney disease: Secondary | ICD-10-CM | POA: Diagnosis not present

## 2020-01-31 DIAGNOSIS — F411 Generalized anxiety disorder: Secondary | ICD-10-CM | POA: Diagnosis not present

## 2020-01-31 DIAGNOSIS — F315 Bipolar disorder, current episode depressed, severe, with psychotic features: Secondary | ICD-10-CM | POA: Diagnosis not present

## 2020-01-31 DIAGNOSIS — F4310 Post-traumatic stress disorder, unspecified: Secondary | ICD-10-CM | POA: Diagnosis not present

## 2020-03-27 DIAGNOSIS — D649 Anemia, unspecified: Secondary | ICD-10-CM | POA: Diagnosis not present

## 2020-03-27 DIAGNOSIS — I129 Hypertensive chronic kidney disease with stage 1 through stage 4 chronic kidney disease, or unspecified chronic kidney disease: Secondary | ICD-10-CM | POA: Diagnosis not present

## 2020-03-27 DIAGNOSIS — E039 Hypothyroidism, unspecified: Secondary | ICD-10-CM | POA: Diagnosis not present

## 2020-03-27 DIAGNOSIS — M810 Age-related osteoporosis without current pathological fracture: Secondary | ICD-10-CM | POA: Diagnosis not present

## 2020-03-27 DIAGNOSIS — E785 Hyperlipidemia, unspecified: Secondary | ICD-10-CM | POA: Diagnosis not present

## 2020-03-27 DIAGNOSIS — F319 Bipolar disorder, unspecified: Secondary | ICD-10-CM | POA: Diagnosis not present

## 2020-03-27 DIAGNOSIS — N1831 Chronic kidney disease, stage 3a: Secondary | ICD-10-CM | POA: Diagnosis not present

## 2020-04-06 DIAGNOSIS — E440 Moderate protein-calorie malnutrition: Secondary | ICD-10-CM | POA: Diagnosis not present

## 2020-04-06 DIAGNOSIS — N1831 Chronic kidney disease, stage 3a: Secondary | ICD-10-CM | POA: Diagnosis not present

## 2020-04-06 DIAGNOSIS — I129 Hypertensive chronic kidney disease with stage 1 through stage 4 chronic kidney disease, or unspecified chronic kidney disease: Secondary | ICD-10-CM | POA: Diagnosis not present

## 2020-04-06 DIAGNOSIS — E039 Hypothyroidism, unspecified: Secondary | ICD-10-CM | POA: Diagnosis not present

## 2020-04-06 DIAGNOSIS — M810 Age-related osteoporosis without current pathological fracture: Secondary | ICD-10-CM | POA: Diagnosis not present

## 2020-04-06 DIAGNOSIS — Z23 Encounter for immunization: Secondary | ICD-10-CM | POA: Diagnosis not present

## 2020-04-06 DIAGNOSIS — E785 Hyperlipidemia, unspecified: Secondary | ICD-10-CM | POA: Diagnosis not present

## 2020-04-10 DIAGNOSIS — F319 Bipolar disorder, unspecified: Secondary | ICD-10-CM | POA: Diagnosis not present

## 2020-04-10 DIAGNOSIS — N1831 Chronic kidney disease, stage 3a: Secondary | ICD-10-CM | POA: Diagnosis not present

## 2020-04-10 DIAGNOSIS — E039 Hypothyroidism, unspecified: Secondary | ICD-10-CM | POA: Diagnosis not present

## 2020-04-10 DIAGNOSIS — M810 Age-related osteoporosis without current pathological fracture: Secondary | ICD-10-CM | POA: Diagnosis not present

## 2020-04-10 DIAGNOSIS — I129 Hypertensive chronic kidney disease with stage 1 through stage 4 chronic kidney disease, or unspecified chronic kidney disease: Secondary | ICD-10-CM | POA: Diagnosis not present

## 2020-04-10 DIAGNOSIS — D649 Anemia, unspecified: Secondary | ICD-10-CM | POA: Diagnosis not present

## 2020-04-10 DIAGNOSIS — E785 Hyperlipidemia, unspecified: Secondary | ICD-10-CM | POA: Diagnosis not present

## 2020-05-10 DIAGNOSIS — R350 Frequency of micturition: Secondary | ICD-10-CM | POA: Diagnosis not present

## 2020-05-10 DIAGNOSIS — R32 Unspecified urinary incontinence: Secondary | ICD-10-CM | POA: Diagnosis not present

## 2020-05-15 DIAGNOSIS — F319 Bipolar disorder, unspecified: Secondary | ICD-10-CM | POA: Diagnosis not present

## 2020-05-15 DIAGNOSIS — N1831 Chronic kidney disease, stage 3a: Secondary | ICD-10-CM | POA: Diagnosis not present

## 2020-05-15 DIAGNOSIS — M810 Age-related osteoporosis without current pathological fracture: Secondary | ICD-10-CM | POA: Diagnosis not present

## 2020-05-15 DIAGNOSIS — E785 Hyperlipidemia, unspecified: Secondary | ICD-10-CM | POA: Diagnosis not present

## 2020-05-15 DIAGNOSIS — E039 Hypothyroidism, unspecified: Secondary | ICD-10-CM | POA: Diagnosis not present

## 2020-05-15 DIAGNOSIS — I129 Hypertensive chronic kidney disease with stage 1 through stage 4 chronic kidney disease, or unspecified chronic kidney disease: Secondary | ICD-10-CM | POA: Diagnosis not present

## 2020-05-15 DIAGNOSIS — D649 Anemia, unspecified: Secondary | ICD-10-CM | POA: Diagnosis not present

## 2020-05-24 DIAGNOSIS — F315 Bipolar disorder, current episode depressed, severe, with psychotic features: Secondary | ICD-10-CM | POA: Diagnosis not present

## 2020-05-24 DIAGNOSIS — F411 Generalized anxiety disorder: Secondary | ICD-10-CM | POA: Diagnosis not present

## 2020-05-24 DIAGNOSIS — F4310 Post-traumatic stress disorder, unspecified: Secondary | ICD-10-CM | POA: Diagnosis not present

## 2020-06-14 DIAGNOSIS — F411 Generalized anxiety disorder: Secondary | ICD-10-CM | POA: Diagnosis not present

## 2020-06-14 DIAGNOSIS — F4310 Post-traumatic stress disorder, unspecified: Secondary | ICD-10-CM | POA: Diagnosis not present

## 2020-06-14 DIAGNOSIS — F315 Bipolar disorder, current episode depressed, severe, with psychotic features: Secondary | ICD-10-CM | POA: Diagnosis not present

## 2020-06-18 DIAGNOSIS — E785 Hyperlipidemia, unspecified: Secondary | ICD-10-CM | POA: Diagnosis not present

## 2020-06-18 DIAGNOSIS — M810 Age-related osteoporosis without current pathological fracture: Secondary | ICD-10-CM | POA: Diagnosis not present

## 2020-06-18 DIAGNOSIS — N1831 Chronic kidney disease, stage 3a: Secondary | ICD-10-CM | POA: Diagnosis not present

## 2020-06-18 DIAGNOSIS — I129 Hypertensive chronic kidney disease with stage 1 through stage 4 chronic kidney disease, or unspecified chronic kidney disease: Secondary | ICD-10-CM | POA: Diagnosis not present

## 2020-06-18 DIAGNOSIS — E039 Hypothyroidism, unspecified: Secondary | ICD-10-CM | POA: Diagnosis not present

## 2020-06-18 DIAGNOSIS — F319 Bipolar disorder, unspecified: Secondary | ICD-10-CM | POA: Diagnosis not present

## 2020-06-18 DIAGNOSIS — D649 Anemia, unspecified: Secondary | ICD-10-CM | POA: Diagnosis not present

## 2020-06-26 DIAGNOSIS — Z01411 Encounter for gynecological examination (general) (routine) with abnormal findings: Secondary | ICD-10-CM | POA: Diagnosis not present

## 2020-06-26 DIAGNOSIS — Z01419 Encounter for gynecological examination (general) (routine) without abnormal findings: Secondary | ICD-10-CM | POA: Diagnosis not present

## 2020-06-26 DIAGNOSIS — Z1231 Encounter for screening mammogram for malignant neoplasm of breast: Secondary | ICD-10-CM | POA: Diagnosis not present

## 2020-06-26 DIAGNOSIS — Z124 Encounter for screening for malignant neoplasm of cervix: Secondary | ICD-10-CM | POA: Diagnosis not present

## 2020-06-26 DIAGNOSIS — Z8041 Family history of malignant neoplasm of ovary: Secondary | ICD-10-CM | POA: Diagnosis not present

## 2020-06-28 DIAGNOSIS — F4310 Post-traumatic stress disorder, unspecified: Secondary | ICD-10-CM | POA: Diagnosis not present

## 2020-06-28 DIAGNOSIS — F315 Bipolar disorder, current episode depressed, severe, with psychotic features: Secondary | ICD-10-CM | POA: Diagnosis not present

## 2020-06-28 DIAGNOSIS — F411 Generalized anxiety disorder: Secondary | ICD-10-CM | POA: Diagnosis not present

## 2020-07-11 DIAGNOSIS — N1831 Chronic kidney disease, stage 3a: Secondary | ICD-10-CM | POA: Diagnosis not present

## 2020-07-11 DIAGNOSIS — E039 Hypothyroidism, unspecified: Secondary | ICD-10-CM | POA: Diagnosis not present

## 2020-07-11 DIAGNOSIS — E785 Hyperlipidemia, unspecified: Secondary | ICD-10-CM | POA: Diagnosis not present

## 2020-07-11 DIAGNOSIS — F319 Bipolar disorder, unspecified: Secondary | ICD-10-CM | POA: Diagnosis not present

## 2020-07-11 DIAGNOSIS — I129 Hypertensive chronic kidney disease with stage 1 through stage 4 chronic kidney disease, or unspecified chronic kidney disease: Secondary | ICD-10-CM | POA: Diagnosis not present

## 2020-07-11 DIAGNOSIS — D649 Anemia, unspecified: Secondary | ICD-10-CM | POA: Diagnosis not present

## 2020-07-11 DIAGNOSIS — M810 Age-related osteoporosis without current pathological fracture: Secondary | ICD-10-CM | POA: Diagnosis not present

## 2020-07-23 DIAGNOSIS — F315 Bipolar disorder, current episode depressed, severe, with psychotic features: Secondary | ICD-10-CM | POA: Diagnosis not present

## 2020-07-23 DIAGNOSIS — F4310 Post-traumatic stress disorder, unspecified: Secondary | ICD-10-CM | POA: Diagnosis not present

## 2020-07-23 DIAGNOSIS — F411 Generalized anxiety disorder: Secondary | ICD-10-CM | POA: Diagnosis not present

## 2020-08-07 DIAGNOSIS — M810 Age-related osteoporosis without current pathological fracture: Secondary | ICD-10-CM | POA: Diagnosis not present

## 2020-08-07 DIAGNOSIS — I129 Hypertensive chronic kidney disease with stage 1 through stage 4 chronic kidney disease, or unspecified chronic kidney disease: Secondary | ICD-10-CM | POA: Diagnosis not present

## 2020-08-07 DIAGNOSIS — E785 Hyperlipidemia, unspecified: Secondary | ICD-10-CM | POA: Diagnosis not present

## 2020-08-07 DIAGNOSIS — F319 Bipolar disorder, unspecified: Secondary | ICD-10-CM | POA: Diagnosis not present

## 2020-08-07 DIAGNOSIS — E039 Hypothyroidism, unspecified: Secondary | ICD-10-CM | POA: Diagnosis not present

## 2020-08-07 DIAGNOSIS — N1831 Chronic kidney disease, stage 3a: Secondary | ICD-10-CM | POA: Diagnosis not present

## 2020-08-07 DIAGNOSIS — D649 Anemia, unspecified: Secondary | ICD-10-CM | POA: Diagnosis not present

## 2020-09-11 DIAGNOSIS — I129 Hypertensive chronic kidney disease with stage 1 through stage 4 chronic kidney disease, or unspecified chronic kidney disease: Secondary | ICD-10-CM | POA: Diagnosis not present

## 2020-09-11 DIAGNOSIS — D649 Anemia, unspecified: Secondary | ICD-10-CM | POA: Diagnosis not present

## 2020-09-11 DIAGNOSIS — M810 Age-related osteoporosis without current pathological fracture: Secondary | ICD-10-CM | POA: Diagnosis not present

## 2020-09-11 DIAGNOSIS — N1831 Chronic kidney disease, stage 3a: Secondary | ICD-10-CM | POA: Diagnosis not present

## 2020-09-11 DIAGNOSIS — E785 Hyperlipidemia, unspecified: Secondary | ICD-10-CM | POA: Diagnosis not present

## 2020-09-11 DIAGNOSIS — E039 Hypothyroidism, unspecified: Secondary | ICD-10-CM | POA: Diagnosis not present

## 2020-09-11 DIAGNOSIS — F319 Bipolar disorder, unspecified: Secondary | ICD-10-CM | POA: Diagnosis not present

## 2020-10-16 DIAGNOSIS — Z0000 Encounter for general adult medical examination without abnormal findings: Secondary | ICD-10-CM | POA: Diagnosis not present

## 2020-10-16 DIAGNOSIS — I129 Hypertensive chronic kidney disease with stage 1 through stage 4 chronic kidney disease, or unspecified chronic kidney disease: Secondary | ICD-10-CM | POA: Diagnosis not present

## 2020-10-16 DIAGNOSIS — Z8673 Personal history of transient ischemic attack (TIA), and cerebral infarction without residual deficits: Secondary | ICD-10-CM | POA: Diagnosis not present

## 2020-10-16 DIAGNOSIS — M810 Age-related osteoporosis without current pathological fracture: Secondary | ICD-10-CM | POA: Diagnosis not present

## 2020-10-16 DIAGNOSIS — Z23 Encounter for immunization: Secondary | ICD-10-CM | POA: Diagnosis not present

## 2020-10-16 DIAGNOSIS — E871 Hypo-osmolality and hyponatremia: Secondary | ICD-10-CM | POA: Diagnosis not present

## 2020-10-16 DIAGNOSIS — N1831 Chronic kidney disease, stage 3a: Secondary | ICD-10-CM | POA: Diagnosis not present

## 2020-10-16 DIAGNOSIS — R7301 Impaired fasting glucose: Secondary | ICD-10-CM | POA: Diagnosis not present

## 2020-10-16 DIAGNOSIS — E785 Hyperlipidemia, unspecified: Secondary | ICD-10-CM | POA: Diagnosis not present

## 2020-10-16 DIAGNOSIS — E039 Hypothyroidism, unspecified: Secondary | ICD-10-CM | POA: Diagnosis not present

## 2020-10-16 DIAGNOSIS — Z1389 Encounter for screening for other disorder: Secondary | ICD-10-CM | POA: Diagnosis not present

## 2020-10-16 DIAGNOSIS — F319 Bipolar disorder, unspecified: Secondary | ICD-10-CM | POA: Diagnosis not present

## 2020-10-18 DIAGNOSIS — F319 Bipolar disorder, unspecified: Secondary | ICD-10-CM | POA: Diagnosis not present

## 2020-10-18 DIAGNOSIS — M810 Age-related osteoporosis without current pathological fracture: Secondary | ICD-10-CM | POA: Diagnosis not present

## 2020-10-18 DIAGNOSIS — I129 Hypertensive chronic kidney disease with stage 1 through stage 4 chronic kidney disease, or unspecified chronic kidney disease: Secondary | ICD-10-CM | POA: Diagnosis not present

## 2020-10-18 DIAGNOSIS — N1831 Chronic kidney disease, stage 3a: Secondary | ICD-10-CM | POA: Diagnosis not present

## 2020-10-18 DIAGNOSIS — E039 Hypothyroidism, unspecified: Secondary | ICD-10-CM | POA: Diagnosis not present

## 2020-10-18 DIAGNOSIS — D649 Anemia, unspecified: Secondary | ICD-10-CM | POA: Diagnosis not present

## 2020-10-18 DIAGNOSIS — E785 Hyperlipidemia, unspecified: Secondary | ICD-10-CM | POA: Diagnosis not present

## 2020-10-22 ENCOUNTER — Emergency Department (HOSPITAL_COMMUNITY)
Admission: EM | Admit: 2020-10-22 | Discharge: 2020-10-22 | Disposition: A | Discharge status: Home / Self Care | Payer: PPO | Attending: Emergency Medicine | Admitting: Emergency Medicine

## 2020-10-22 ENCOUNTER — Encounter (HOSPITAL_COMMUNITY): Payer: Self-pay

## 2020-10-22 ENCOUNTER — Other Ambulatory Visit: Payer: Self-pay

## 2020-10-22 ENCOUNTER — Emergency Department (HOSPITAL_COMMUNITY): Payer: PPO

## 2020-10-22 DIAGNOSIS — K625 Hemorrhage of anus and rectum: Secondary | ICD-10-CM | POA: Diagnosis present

## 2020-10-22 DIAGNOSIS — N1330 Unspecified hydronephrosis: Secondary | ICD-10-CM | POA: Diagnosis not present

## 2020-10-22 DIAGNOSIS — Z7982 Long term (current) use of aspirin: Secondary | ICD-10-CM | POA: Insufficient documentation

## 2020-10-22 DIAGNOSIS — Z91040 Latex allergy status: Secondary | ICD-10-CM | POA: Insufficient documentation

## 2020-10-22 DIAGNOSIS — N3289 Other specified disorders of bladder: Secondary | ICD-10-CM | POA: Diagnosis not present

## 2020-10-22 DIAGNOSIS — K644 Residual hemorrhoidal skin tags: Secondary | ICD-10-CM | POA: Diagnosis not present

## 2020-10-22 DIAGNOSIS — K5100 Ulcerative (chronic) pancolitis without complications: Secondary | ICD-10-CM | POA: Diagnosis not present

## 2020-10-22 DIAGNOSIS — I10 Essential (primary) hypertension: Secondary | ICD-10-CM | POA: Insufficient documentation

## 2020-10-22 DIAGNOSIS — Z79899 Other long term (current) drug therapy: Secondary | ICD-10-CM | POA: Diagnosis not present

## 2020-10-22 DIAGNOSIS — K449 Diaphragmatic hernia without obstruction or gangrene: Secondary | ICD-10-CM | POA: Diagnosis not present

## 2020-10-22 DIAGNOSIS — R58 Hemorrhage, not elsewhere classified: Secondary | ICD-10-CM | POA: Diagnosis not present

## 2020-10-22 DIAGNOSIS — K529 Noninfective gastroenteritis and colitis, unspecified: Secondary | ICD-10-CM | POA: Diagnosis not present

## 2020-10-22 LAB — URINALYSIS, ROUTINE W REFLEX MICROSCOPIC
Bacteria, UA: NONE SEEN
Bilirubin Urine: NEGATIVE
Glucose, UA: NEGATIVE mg/dL
Ketones, ur: NEGATIVE mg/dL
Leukocytes,Ua: NEGATIVE
Nitrite: NEGATIVE
Protein, ur: NEGATIVE mg/dL
Specific Gravity, Urine: 1.009 (ref 1.005–1.030)
pH: 8 (ref 5.0–8.0)

## 2020-10-22 LAB — CBC WITH DIFFERENTIAL/PLATELET
Abs Immature Granulocytes: 0.06 10*3/uL (ref 0.00–0.07)
Basophils Absolute: 0.1 10*3/uL (ref 0.0–0.1)
Basophils Relative: 1 %
Eosinophils Absolute: 0.1 10*3/uL (ref 0.0–0.5)
Eosinophils Relative: 1 %
HCT: 34 % — ABNORMAL LOW (ref 36.0–46.0)
Hemoglobin: 11.4 g/dL — ABNORMAL LOW (ref 12.0–15.0)
Immature Granulocytes: 1 %
Lymphocytes Relative: 12 %
Lymphs Abs: 1.2 10*3/uL (ref 0.7–4.0)
MCH: 31.4 pg (ref 26.0–34.0)
MCHC: 33.5 g/dL (ref 30.0–36.0)
MCV: 93.7 fL (ref 80.0–100.0)
Monocytes Absolute: 0.7 10*3/uL (ref 0.1–1.0)
Monocytes Relative: 6 %
Neutro Abs: 8.2 10*3/uL — ABNORMAL HIGH (ref 1.7–7.7)
Neutrophils Relative %: 79 %
Platelets: 366 10*3/uL (ref 150–400)
RBC: 3.63 MIL/uL — ABNORMAL LOW (ref 3.87–5.11)
RDW: 13.3 % (ref 11.5–15.5)
WBC: 10.2 10*3/uL (ref 4.0–10.5)
nRBC: 0 % (ref 0.0–0.2)

## 2020-10-22 LAB — COMPREHENSIVE METABOLIC PANEL
ALT: 27 U/L (ref 0–44)
AST: 22 U/L (ref 15–41)
Albumin: 4 g/dL (ref 3.5–5.0)
Alkaline Phosphatase: 76 U/L (ref 38–126)
Anion gap: 9 (ref 5–15)
BUN: 22 mg/dL (ref 8–23)
CO2: 30 mmol/L (ref 22–32)
Calcium: 9.8 mg/dL (ref 8.9–10.3)
Chloride: 91 mmol/L — ABNORMAL LOW (ref 98–111)
Creatinine, Ser: 0.84 mg/dL (ref 0.44–1.00)
GFR, Estimated: >60 mL/min (ref >60)
Glucose, Bld: 123 mg/dL — ABNORMAL HIGH (ref 70–99)
Potassium: 4.3 mmol/L (ref 3.5–5.1)
Sodium: 130 mmol/L — ABNORMAL LOW (ref 135–145)
Total Bilirubin: 0.4 mg/dL (ref 0.3–1.2)
Total Protein: 7.1 g/dL (ref 6.5–8.1)

## 2020-10-22 MED ORDER — HYDROCORTISONE (PERIANAL) 2.5 % EX CREA: 1.0000 "application " | TOPICAL_CREAM | Freq: Two times a day (BID) | CUTANEOUS | 0 refills | Status: AC

## 2020-10-22 MED ORDER — AMOXICILLIN-POT CLAVULANATE 875-125 MG PO TABS
1.0000 | ORAL_TABLET | Freq: Once | ORAL | Status: AC
  Administered 2020-10-22: 21:00:00 1 via ORAL

## 2020-10-22 MED ORDER — IOHEXOL 300 MG/ML  SOLN
100.0000 mL | Freq: Once | INTRAMUSCULAR | Status: AC | PRN
  Administered 2020-10-22: 19:00:00 100 mL via INTRAVENOUS

## 2020-10-22 MED ORDER — SODIUM CHLORIDE (PF) 0.9 % IJ SOLN
INTRAMUSCULAR | Status: AC
  Filled 2020-10-22: qty 50

## 2020-10-22 MED ORDER — HYDROCORTISONE ACETATE 25 MG RE SUPP
25.0000 mg | Freq: Once | RECTAL | Status: AC
  Administered 2020-10-22: 21:00:00 25 mg via RECTAL
  Filled 2020-10-22: qty 1

## 2020-10-22 MED ORDER — AMOXICILLIN-POT CLAVULANATE 875-125 MG PO TABS: 1.0000 | ORAL_TABLET | Freq: Two times a day (BID) | ORAL | 0 refills | Status: AC

## 2020-10-22 NOTE — ED Triage Notes (Signed)
Per EMS pt has had some bleeding from rectum area. Pt has hx of hemorrhoids. Pain when pooping. Alert and oriented per EMS.  Pt reports solid stool.   VS BP 144/70, P: 77, RR 20, O2 97%, T: 98.3, CBG 167 with EMS

## 2020-10-22 NOTE — ED Notes (Signed)
An After Visit Summary was printed and given to the patient. Discharge instructions given and no further questions at this time.  Pt leaving with husband. Pt able to stand and pivot to wheelchair, pt able to stand from wheelchair and ambulate a few steps to car.

## 2020-10-22 NOTE — Discharge Instructions (Addendum)
Take antibiotics as prescribed.  Take entire course, even if symptoms improve. Use the steroid suppository to help with inflammation and pain of the rectum. Use sitz bath as needed to help with hemorrhoid. Interesting well-hydrated water and nutritional is coming out easily.  If not, take a stool softener and/or MiraLAX as needed. Call your primary care doctor tomorrow to set up a follow-up appointment. Return to emergency room if you develop fevers, vomiting, severe worsening pain, any new, worsening, concerning symptoms

## 2020-10-22 NOTE — ED Provider Notes (Signed)
[facility] DEPT Provider Note   CSN: [id number] Arrival date & time: 10/22/20  1501     History No chief complaint on file.   Nichole Foster is a 80 y.o. female presenting for evaluation of rectal bleeding.  Patient states she has been having rectal bleeding and stomach pain.  She states this is been going on for several months, but her pain is worsened recently.  She states she has pain in between having bowel movements, however it is worse with bowel movements.  Bleeding is only when having a bowel movement, is bright red.  She is not on blood thinners.  She has fevers, chills, nausea, vomiting, urinary symptoms.  Her pain is in her lower abdomen, not localized to the left.  She thinks she has a history of hemorrhoids, but does not know specifically, and cannot state if this feels similar.  On chart review, pt with h/o htn, hld, cva, osteoporosis. Pt is on aggrenox.   HPI     Past Medical History:  Diagnosis Date   Heart murmur    High cholesterol    Hypertension    Osteoporosis    Panic attack    Stroke [facility])    Thyroid disease     Patient Active Problem List   Diagnosis Date Noted   Thyroid disease    Hypertension    High cholesterol    Hyponatremia 02/12/2013   Acute encephalopathy 02/12/2013   Osteoporosis    Panic attack     Past Surgical History:  Procedure Laterality Date   CATARACT EXTRACTION       OB History   No obstetric history on file.     History reviewed. No pertinent family history.  Social History   Tobacco Use   Smoking status: Never   Smokeless tobacco: Never  Substance Use Topics   Alcohol use: No   Drug use: No    Home Medications Prior to Admission medications   Medication Sig Start Date End Date Taking? Authorizing Provider  alendronate (FOSAMAX) 70 MG tablet Take 70 mg by mouth every 7 (seven) days. Take with a full glass of water on an empty stomach.   Yes [provider]   amLODipine (NORVASC) 10 MG tablet Take 10 mg by mouth daily. 04/24/16  Yes [provider]  amoxicillin-clavulanate (AUGMENTIN) 875-125 MG tablet Take 1 tablet by mouth every 12 (twelve) hours for 10 days. 10/22/20 11/01/20 Yes Yuriko Portales, PA-C  Calcium Carbonate-Vitamin D (CALCIUM 600 + D PO) Take 1 tablet by mouth daily.   Yes [provider]  CRANBERRY PO Take 500 mg by mouth daily.   Yes [provider]  dipyridamole-aspirin (AGGRENOX) 200-25 MG per 12 hr capsule Take 1 capsule by mouth 2 (two) times daily.   Yes [provider]  docusate sodium (COLACE) 100 MG capsule Take 100 mg by mouth 2 (two) times daily.   Yes [provider]  ESTRACE VAGINAL 0.1 MG/GM vaginal cream Place 2 g vaginally daily as needed (dryness). 02/02/13  Yes [provider]  fexofenadine (ALLEGRA) 180 MG tablet Take 90 mg by mouth daily.   Yes [provider]  fluticasone (FLONASE) 50 MCG/ACT nasal spray Place 2 sprays into the nose daily.   Yes [provider]  hydrocortisone (ANUSOL-HC) 2.5 % rectal cream Place 1 application rectally 2 (two) times daily. 10/22/20  Yes Tullio Chausse, PA-C  IRON PO Take 45 mg by mouth every evening.   Yes [provider]  lamoTRIgine (LAMICTAL) 25 MG tablet Take 50 mg by mouth every evening. 05/09/16  Yes [provider]  levothyroxine (SYNTHROID, LEVOTHROID) 50 MCG tablet Take 50 mcg by mouth daily before breakfast.   Yes [provider]  lisinopril (ZESTRIL) 40 MG tablet Take 40 mg by mouth every morning. 10/11/20  Yes [provider]  Multiple Vitamin (MULTIVITAMIN WITH MINERALS) TABS tablet Take 1 tablet by mouth daily.   Yes [provider]  Omega-3 Fatty Acids (FISH OIL) 1200 MG CAPS Take 1 capsule by mouth in the morning and at bedtime.   Yes [provider]  polyethylene glycol (MIRALAX / GLYCOLAX) packet Take 17 g by mouth daily as needed. Patient  taking differently: Take 17 g by mouth daily. 02/28/13  Yes Hosie Poisson, MD  QUEtiapine (SEROQUEL) 100 MG tablet Take 100 mg by mouth at bedtime. 04/22/16  Yes [provider]  simvastatin (ZOCOR) 20 MG tablet Take 20 mg by mouth every evening. 10/11/20  Yes [provider]  FLUoxetine (PROZAC) 10 MG capsule Take 1 capsule (10 mg total) by mouth daily. Patient not taking: No sig reported 02/28/13   Hosie Poisson, MD  lisinopril (PRINIVIL,ZESTRIL) 20 MG tablet Take 1 tablet (20 mg total) by mouth daily. Patient not taking: Reported on 10/22/2020 02/13/13   Verlee Monte, MD  risperiDONE (RISPERDAL M-TABS) 0.5 MG disintegrating tablet Take 1 tablet (0.5 mg total) by mouth 2 (two) times daily. Patient not taking: No sig reported 02/28/13   Hosie Poisson, MD  senna-docusate (SENOKOT-S) 8.6-50 MG per tablet Take 2 tablets by mouth 2 (two) times daily. Patient not taking: No sig reported 02/28/13   Hosie Poisson, MD    Allergies    Sulfa antibiotics and Latex  Review of Systems   Review of Systems  Gastrointestinal:  Positive for anal bleeding and rectal pain.  All other systems reviewed and are negative.  Physical Exam Updated Vital Signs BP (!) 146/56 (BP Location: Left Arm)   Pulse 61   Temp 98.1 F (36.7 C) (Oral)   Resp 18   Wt 56.2 kg   SpO2 100%   BMI 22.68 kg/m   Physical Exam Vitals and nursing note reviewed. Exam conducted with a chaperone present.  Constitutional:      General: She is not in acute distress.    Appearance: Normal appearance.     Comments: Resting comfortably in the bed in NAD  HENT:     Head: Normocephalic and atraumatic.  Eyes:     Extraocular Movements: Extraocular movements intact.     Conjunctiva/sclera: Conjunctivae normal.     Pupils: Pupils are equal, round, and reactive to light.  Cardiovascular:     Rate and Rhythm: Normal rate and regular rhythm.     Pulses: Normal pulses.  Pulmonary:     Effort: Pulmonary effort is normal.  No respiratory distress.     Breath sounds: Normal breath sounds. No wheezing.     Comments: Speaking in full sentences.  Clear lung sounds in all fields. Abdominal:     General: There is no distension.     Palpations: Abdomen is soft. There is no mass.     Tenderness: There is no abdominal tenderness. There is no guarding or rebound.  Genitourinary:    Rectum: External hemorrhoid present.       Comments: small external hemorrhoid without thrombosis or active bleeding. No internal hemorrhoids palpated. No gross rectal bleeding.  Musculoskeletal:  General: Normal range of motion.     Cervical back: Normal range of motion and neck supple.  Skin:    General: Skin is warm and dry.     Capillary Refill: Capillary refill takes less than 2 seconds.  Neurological:     Mental Status: She is alert and oriented to person, place, and time.  Psychiatric:        Mood and Affect: Mood and affect normal.        Speech: Speech normal.        Behavior: Behavior normal.    ED Results / Procedures / Treatments   Labs (all labs ordered are listed, but only abnormal results are displayed) Labs Reviewed  CBC WITH DIFFERENTIAL/PLATELET - Abnormal; Notable for the following components:      Result Value   RBC 3.63 (*)    Hemoglobin 11.4 (*)    HCT 34.0 (*)    Neutro Abs 8.2 (*)    All other components within normal limits  COMPREHENSIVE METABOLIC PANEL - Abnormal; Notable for the following components:   Sodium 130 (*)    Chloride 91 (*)    Glucose, Bld 123 (*)    All other components within normal limits  URINALYSIS, ROUTINE W REFLEX MICROSCOPIC - Abnormal; Notable for the following components:   APPearance HAZY (*)    Hgb urine dipstick MODERATE (*)    All other components within normal limits  URINE CULTURE    EKG None  Radiology CT ABDOMEN PELVIS W CONTRAST  Result Date: 10/22/2020 CLINICAL DATA:  Rectal bleeding. EXAM: CT ABDOMEN AND PELVIS WITH CONTRAST TECHNIQUE:  Multidetector CT imaging of the abdomen and pelvis was performed using the standard protocol following bolus administration of intravenous contrast. CONTRAST:  132mL OMNIPAQUE IOHEXOL 300 MG/ML  SOLN COMPARISON:  None. FINDINGS: Lower chest: No acute abnormality. Hepatobiliary: No focal liver abnormality is seen. No gallstones, gallbladder wall thickening, or biliary dilatation. Pancreas: A 7 mm diameter cyst is seen within the body of the pancreas (axial CT image 24, CT series number 2). There is no evidence of pancreatic ductal dilatation. Spleen: No splenic injury or perisplenic hematoma. Adrenals/Urinary Tract: Adrenal glands are unremarkable. Kidneys are normal in size, without renal calculi or focal lesions. Mild bilateral hydronephrosis and hydroureter is seen. The urinary bladder is markedly distended. Stomach/Bowel: There is a small hiatal hernia. Appendix appears normal. A large amount of stool is seen throughout the colon. No evidence of bowel dilatation. Marked severity thickening of the distal sigmoid colon and rectum is noted. Vascular/Lymphatic: Aortic atherosclerosis. No enlarged abdominal or pelvic lymph nodes. Reproductive: Uterus and bilateral adnexa are unremarkable. Other: No abdominal wall hernia or abnormality. No abdominopelvic ascites. Musculoskeletal: No acute or significant osseous findings. IMPRESSION: 1. Marked severity rectosigmoid colitis. 2. Markedly distended urinary bladder with subsequent mild bilateral hydronephrosis and hydroureter. Electronically Signed   By: Virgina Norfolk M.D.   On: 10/22/2020 19:20    Procedures Procedures   Medications Ordered in ED Medications  amoxicillin-clavulanate (AUGMENTIN) 875-125 MG per tablet 1 tablet (has no administration in time range)  hydrocortisone (ANUSOL-HC) suppository 25 mg (has no administration in time range)  iohexol (OMNIPAQUE) 300 MG/ML solution 100 mL (100 mLs Intravenous Contrast Given 10/22/20 1850)  sodium chloride  (PF) 0.9 % injection (  Given by Other 10/22/20 1900)    ED Course  I have reviewed the triage vital signs and the nursing notes.  Pertinent labs & imaging results that were available during my care of  the patient were reviewed by me and considered in my medical decision making (see chart for details).    MDM Rules/Calculators/A&P                          Patient presenting for evaluation of rectal pain and bleeding.  On exam, patient peers nontoxic.  Rectal exam does show an external hemorrhoid which is mildly swollen, but without thrombosis or active bleeding.  Most likely patient symptoms are due to her external hemorrhoid.  However she reports lower abdominal pain which happens when she is not having bowel movements, also consider diverticulitis.  Will check labs to ensure no significant abnormality.  Will obtain CT abdomen pelvis for evaluation of diverticulitis.  If work-up is overall reassuring, will plan for treatment for external hemorrhoid.  CT shows rectosigmoid colitis and bladder distention.  Is likely contributing to patient's symptoms.  Discussed findings with patient and husband.  Offered admission, patient and husband declined.  I discussed importance of taking antibiotics as prescribed as well as close follow-up with PCP.  Strict return precautions given.  Additionally, patient given treatment for her hemorrhoid.  At this time, patient appears safe for discharge.  Return precautions given.  Patient and husband state they understand and agree to plan.   Final Clinical Impression(s) / ED Diagnoses Final diagnoses:  Pancol[facility])  External hemorrhoid    Rx / DC Orders ED Discharge Orders          Ordered    amoxicillin-clavulanate (AUGMENTIN) 875-125 MG tablet  Every 12 hours        10/22/20 2008    hydrocortisone (ANUSOL-HC) 2.5 % rectal cream  2 times daily        10/22/20 2008             Franchot Heidelberg, Hershal Coria 10/22/20 2108    Quintella Reichert,  MD 10/22/20 [phone]

## 2020-10-24 LAB — URINE CULTURE

## 2020-10-26 DIAGNOSIS — K645 Perianal venous thrombosis: Secondary | ICD-10-CM | POA: Diagnosis not present

## 2020-11-02 ENCOUNTER — Other Ambulatory Visit: Payer: Self-pay

## 2020-11-02 ENCOUNTER — Emergency Department (HOSPITAL_COMMUNITY)
Admission: EM | Admit: 2020-11-02 | Discharge: 2020-11-02 | Disposition: A | Discharge status: Home / Self Care | Payer: PPO | Attending: Emergency Medicine | Admitting: Emergency Medicine

## 2020-11-02 ENCOUNTER — Encounter (HOSPITAL_COMMUNITY): Payer: Self-pay

## 2020-11-02 ENCOUNTER — Emergency Department (HOSPITAL_COMMUNITY): Payer: PPO

## 2020-11-02 DIAGNOSIS — Z91040 Latex allergy status: Secondary | ICD-10-CM | POA: Diagnosis not present

## 2020-11-02 DIAGNOSIS — Z79899 Other long term (current) drug therapy: Secondary | ICD-10-CM | POA: Insufficient documentation

## 2020-11-02 DIAGNOSIS — K449 Diaphragmatic hernia without obstruction or gangrene: Secondary | ICD-10-CM | POA: Diagnosis not present

## 2020-11-02 DIAGNOSIS — K6389 Other specified diseases of intestine: Secondary | ICD-10-CM | POA: Diagnosis not present

## 2020-11-02 DIAGNOSIS — R197 Diarrhea, unspecified: Secondary | ICD-10-CM | POA: Insufficient documentation

## 2020-11-02 DIAGNOSIS — I129 Hypertensive chronic kidney disease with stage 1 through stage 4 chronic kidney disease, or unspecified chronic kidney disease: Secondary | ICD-10-CM | POA: Insufficient documentation

## 2020-11-02 DIAGNOSIS — N189 Chronic kidney disease, unspecified: Secondary | ICD-10-CM | POA: Insufficient documentation

## 2020-11-02 DIAGNOSIS — K6289 Other specified diseases of anus and rectum: Secondary | ICD-10-CM | POA: Diagnosis not present

## 2020-11-02 LAB — CBC WITH DIFFERENTIAL/PLATELET
Abs Immature Granulocytes: 0.06 10*3/uL (ref 0.00–0.07)
Basophils Absolute: 0 10*3/uL (ref 0.0–0.1)
Basophils Relative: 0 %
Eosinophils Absolute: 0.2 10*3/uL (ref 0.0–0.5)
Eosinophils Relative: 2 %
HCT: 31.4 % — ABNORMAL LOW (ref 36.0–46.0)
Hemoglobin: 10.2 g/dL — ABNORMAL LOW (ref 12.0–15.0)
Immature Granulocytes: 1 %
Lymphocytes Relative: 14 %
Lymphs Abs: 1.3 10*3/uL (ref 0.7–4.0)
MCH: 31.2 pg (ref 26.0–34.0)
MCHC: 32.5 g/dL (ref 30.0–36.0)
MCV: 96 fL (ref 80.0–100.0)
Monocytes Absolute: 0.6 10*3/uL (ref 0.1–1.0)
Monocytes Relative: 7 %
Neutro Abs: 7 10*3/uL (ref 1.7–7.7)
Neutrophils Relative %: 76 %
Platelets: 422 10*3/uL — ABNORMAL HIGH (ref 150–400)
RBC: 3.27 MIL/uL — ABNORMAL LOW (ref 3.87–5.11)
RDW: 14.2 % (ref 11.5–15.5)
WBC: 9.2 10*3/uL (ref 4.0–10.5)
nRBC: 0 % (ref 0.0–0.2)

## 2020-11-02 LAB — COMPREHENSIVE METABOLIC PANEL
ALT: 109 U/L — ABNORMAL HIGH (ref 0–44)
AST: 91 U/L — ABNORMAL HIGH (ref 15–41)
Albumin: 3 g/dL — ABNORMAL LOW (ref 3.5–5.0)
Alkaline Phosphatase: 57 U/L (ref 38–126)
Anion gap: 6 (ref 5–15)
BUN: 24 mg/dL — ABNORMAL HIGH (ref 8–23)
CO2: 25 mmol/L (ref 22–32)
Calcium: 8.2 mg/dL — ABNORMAL LOW (ref 8.9–10.3)
Chloride: 103 mmol/L (ref 98–111)
Creatinine, Ser: 0.95 mg/dL (ref 0.44–1.00)
GFR, Estimated: >60 mL/min (ref >60)
Glucose, Bld: 103 mg/dL — ABNORMAL HIGH (ref 70–99)
Potassium: 4 mmol/L (ref 3.5–5.1)
Sodium: 134 mmol/L — ABNORMAL LOW (ref 135–145)
Total Bilirubin: 0.6 mg/dL (ref 0.3–1.2)
Total Protein: 5.4 g/dL — ABNORMAL LOW (ref 6.5–8.1)

## 2020-11-02 MED ORDER — IOHEXOL 350 MG/ML SOLN
100.0000 mL | Freq: Once | INTRAVENOUS | Status: AC | PRN
  Administered 2020-11-02: 80 mL via INTRAVENOUS

## 2020-11-02 NOTE — ED Provider Notes (Signed)
Emergency Medicine Provider Triage Evaluation Note  ARIADNE RISSMILLER , a 80 y.o. female  was evaluated in triage.  Pt complains of rectal pain and prolapse that has been ongoing  Review of Systems  Positive: Rectal pain/prolapse Negative: Fevers, abd pain  Physical Exam  BP 111/75 (BP Location: Left Arm)   Pulse 81   Temp 98.9 F (37.2 C) (Oral)   Resp 18   Ht 5\' 2"  (1.575 m)   Wt 59.9 kg   SpO2 98%   BMI 24.14 kg/m  Gen:   Awake, no distress   Resp:  Normal effort  MSK:   Moves extremities without difficulty   Medical Decision Making  Medically screening exam initiated at 2:49 PM.  Appropriate orders placed.  Kortlyn Koltz [facility] was informed that the remainder of the evaluation will be completed by another provider, this initial triage assessment does not replace that evaluation, and the importance of remaining in the ED until their evaluation is complete.     Bishop Dublin 11/02/20 1450    Milton Ferguson, MD 11/04/20 1645

## 2020-11-02 NOTE — ED Provider Notes (Addendum)
[facility] DEPT Provider Note   CSN: [id number] Arrival date & time: 11/02/20  1407     History Chief Complaint  Patient presents with   Rectal Pain    Nichole Foster is a 80 y.o. female with history significant for CVA, hypertension, CKD, bipolar who presents for evaluation of rectal pain.  Patient has had intermittent rectal pain for "a long time."  Patient states was seen here last week and diagnosed with a hemorrhoid as well as pancolitis.  Completed the antibiotics.  Abdominal pain has improved however still having "mush stool." Has been using OTC medications to soften stool to aid in patient BM due to hx of rectal prolapse. Has been noted 2 days ago Nichole Foster had a prolapsed rectum. Family attempted to reduce yesterday however was unable to. Apparently was "out" intermittently over the last 2 days.  He is not currently followed by GI or general surgery.  Nichole Foster has had increased pain to this area when her rectum prolapses.  Apparently according to husband every time Nichole Foster has a bowel movement has a rectal prolapse.  Will occasionally reduce on its own.  No fever, chills, emesis, rectal bleeding, weakness, dysuria or hematuria.  Husband states patient has become incontinent of stool due to her prolapsed rectum when this occurs.  Denies additional aggravating or alleviating factors.    Husband does show me a picture which does show rectal prolapse.  History obtained from patient and past medical records.  No interpreter used  HPI     Past Medical History:  Diagnosis Date   Heart murmur    High cholesterol    Hypertension    Osteoporosis    Panic attack    Stroke [facility])    Thyroid disease     Patient Active Problem List   Diagnosis Date Noted   Thyroid disease    Hypertension    High cholesterol    Hyponatremia 02/12/2013   Acute encephalopathy 02/12/2013   Osteoporosis    Panic attack     Past Surgical History:  Procedure Laterality Date    CATARACT EXTRACTION       OB History   No obstetric history on file.     History reviewed. No pertinent family history.  Social History   Tobacco Use   Smoking status: Never   Smokeless tobacco: Never  Vaping Use   Vaping Use: Never used  Substance Use Topics   Alcohol use: No   Drug use: No    Home Medications Prior to Admission medications   Medication Sig Start Date End Date Taking? Authorizing Provider  alendronate (FOSAMAX) 70 MG tablet Take 70 mg by mouth every 7 (seven) days. Take with a full glass of water on an empty stomach.    [provider]  amLODipine (NORVASC) 10 MG tablet Take 10 mg by mouth daily. 04/24/16   [provider]  Calcium Carbonate-Vitamin D (CALCIUM 600 + D PO) Take 1 tablet by mouth daily.    [provider]  CRANBERRY PO Take 500 mg by mouth daily.    [provider]  dipyridamole-aspirin (AGGRENOX) 200-25 MG per 12 hr capsule Take 1 capsule by mouth 2 (two) times daily.    [provider]  docusate sodium (COLACE) 100 MG capsule Take 100 mg by mouth 2 (two) times daily.    [provider]  ESTRACE VAGINAL 0.1 MG/GM vaginal cream Place 2 g vaginally daily as needed (dryness). 02/02/13   [provider]  fexofenadine (ALLEGRA) 180 MG tablet Take 90 mg by mouth daily.    [provider]  FLUoxetine (PROZAC) 10 MG capsule Take 1 capsule (10 mg total) by mouth daily. Patient not taking: No sig reported 02/28/13   Hosie Poisson, MD  fluticasone (FLONASE) 50 MCG/ACT nasal spray Place 2 sprays into the nose daily.    [provider]  hydrocortisone (ANUSOL-HC) 2.5 % rectal cream Place 1 application rectally 2 (two) times daily. 10/22/20   Caccavale, Sophia, PA-C  IRON PO Take 45 mg by mouth every evening.    [provider]  lamoTRIgine (LAMICTAL) 25 MG tablet Take 50 mg by mouth every evening. 05/09/16   [provider]  levothyroxine (SYNTHROID, LEVOTHROID)  50 MCG tablet Take 50 mcg by mouth daily before breakfast.    [provider]  lisinopril (PRINIVIL,ZESTRIL) 20 MG tablet Take 1 tablet (20 mg total) by mouth daily. Patient not taking: Reported on 10/22/2020 02/13/13   Verlee Monte, MD  lisinopril (ZESTRIL) 40 MG tablet Take 40 mg by mouth every morning. 10/11/20   [provider]  Multiple Vitamin (MULTIVITAMIN WITH MINERALS) TABS tablet Take 1 tablet by mouth daily.    [provider]  Omega-3 Fatty Acids (FISH OIL) 1200 MG CAPS Take 1 capsule by mouth in the morning and at bedtime.    [provider]  polyethylene glycol (MIRALAX / GLYCOLAX) packet Take 17 g by mouth daily as needed. Patient taking differently: Take 17 g by mouth daily. 02/28/13   Hosie Poisson, MD  QUEtiapine (SEROQUEL) 100 MG tablet Take 100 mg by mouth at bedtime. 04/22/16   [provider]  risperiDONE (RISPERDAL M-TABS) 0.5 MG disintegrating tablet Take 1 tablet (0.5 mg total) by mouth 2 (two) times daily. Patient not taking: No sig reported 02/28/13   Hosie Poisson, MD  senna-docusate (SENOKOT-S) 8.6-50 MG per tablet Take 2 tablets by mouth 2 (two) times daily. Patient not taking: No sig reported 02/28/13   Hosie Poisson, MD  simvastatin (ZOCOR) 20 MG tablet Take 20 mg by mouth every evening. 10/11/20   [provider]    Allergies    Sulfa antibiotics and Latex  Review of Systems   Review of Systems  Constitutional: Negative.   HENT: Negative.    Respiratory: Negative.    Cardiovascular: Negative.   Gastrointestinal:  Positive for diarrhea and rectal pain. Negative for abdominal distention, abdominal pain, anal bleeding, blood in stool, constipation, nausea and vomiting.  Genitourinary: Negative.   Musculoskeletal: Negative.   Skin: Negative.   Neurological: Negative.   All other systems reviewed and are negative.  Physical Exam Updated Vital Signs BP 133/83   Pulse 84   Temp 98.4 F (36.9 C) (Oral)    Resp 18   Ht 5\' 2"  (1.575 m)   Wt 59.9 kg   SpO2 100%   BMI 24.14 kg/m   Physical Exam Vitals and nursing note reviewed. Exam conducted with a chaperone present.  Constitutional:      General: Nichole Foster is not in acute distress.    Appearance: Nichole Foster is well-developed. Nichole Foster is not ill-appearing, toxic-appearing or diaphoretic.  HENT:     Head: Normocephalic and atraumatic.     Nose: Nose normal.     Mouth/Throat:     Mouth: Mucous membranes are moist.  Eyes:     Pupils: Pupils are equal, round, and reactive to light.  Cardiovascular:     Rate and Rhythm: Normal rate.     Pulses: Normal  pulses.     Heart sounds: Normal heart sounds.  Pulmonary:     Effort: Pulmonary effort is normal. No respiratory distress.     Breath sounds: Normal breath sounds.  Abdominal:     General: Bowel sounds are normal. There is no distension.     Palpations: Abdomen is soft.  Genitourinary:    Comments: Attending Dr. Dina Rich in room for exam. Browns stool on exam. No melena or BRBPR. No rectal prolapse Musculoskeletal:        General: No swelling, tenderness, deformity or signs of injury. Normal range of motion.     Cervical back: Normal range of motion.     Comments: Moves all 4 extremities without difficulty  Skin:    General: Skin is warm and dry.     Capillary Refill: Capillary refill takes less than 2 seconds.     Comments: No rashes or lesions. No skin breakdown  Neurological:     General: No focal deficit present.     Mental Status: Nichole Foster is alert.  Psychiatric:        Mood and Affect: Mood normal.   ED Results / Procedures / Treatments   Labs (all labs ordered are listed, but only abnormal results are displayed) Labs Reviewed  CBC WITH DIFFERENTIAL/PLATELET - Abnormal; Notable for the following components:      Result Value   RBC 3.27 (*)    Hemoglobin 10.2 (*)    HCT 31.4 (*)    Platelets 422 (*)    All other components within normal limits  COMPREHENSIVE METABOLIC PANEL - Abnormal;  Notable for the following components:   Sodium 134 (*)    Glucose, Bld 103 (*)    BUN 24 (*)    Calcium 8.2 (*)    Total Protein 5.4 (*)    Albumin 3.0 (*)    AST 91 (*)    ALT 109 (*)    All other components within normal limits    EKG None  Radiology CT ABDOMEN PELVIS W CONTRAST  Result Date: 11/02/2020 CLINICAL DATA:  Diarrhea. History of pancolitis. Recurrent rectal prolapse. EXAM: CT ABDOMEN AND PELVIS WITH CONTRAST TECHNIQUE: Multidetector CT imaging of the abdomen and pelvis was performed using the standard protocol following bolus administration of intravenous contrast. CONTRAST:  56mL OMNIPAQUE IOHEXOL 350 MG/ML SOLN COMPARISON:  CT abdomen pelvis 10/22/2020 FINDINGS: Lower chest: No acute abnormality.  Tiny hiatal hernia. Hepatobiliary: No focal liver abnormality. No gallstones, gallbladder wall thickening, or pericholecystic fluid. No biliary dilatation. Pancreas: No focal lesion. Normal pancreatic contour. No surrounding inflammatory changes. No main pancreatic ductal dilatation. Spleen: Normal in size without focal abnormality. Adrenals/Urinary Tract: No adrenal nodule bilaterally. Bilateral kidneys enhance symmetrically. No hydronephrosis. No hydroureter. The urinary bladder is distended with urine. On delayed imaging, there is no urothelial wall thickening and there are no filling defects in the opacified portions of the bilateral collecting systems or ureters. Stomach/Bowel: Stomach is within normal limits. No evidence of small bowel wall thickening or dilatation. Redemonstration of rectal wall thickening, mucosal hyperemia, and rectal fat stranding. Appendix appears normal. Vascular/Lymphatic: No abdominal aorta or iliac aneurysm. Mild atherosclerotic plaque of the aorta and its branches. No abdominal, pelvic, or inguinal lymphadenopathy. Reproductive: Uterus and bilateral adnexa are unremarkable. Other: No intraperitoneal free fluid. No intraperitoneal free gas. No organized fluid  collection. Musculoskeletal: No abdominal wall hernia or abnormality. No suspicious lytic or blastic osseous lesions. Similar benign appearing subchondral cystic lesion of the right acetabula likely related to degenerative  changes. No acute displaced fracture. Multilevel degenerative changes of the spine. IMPRESSION: 1. Proctitis. 2. Tiny hiatal hernia. 3.  Aortic Atherosclerosis (ICD10-I70.0). Electronically Signed   By: Iven Finn M.D.   On: 11/02/2020 18:19    Procedures Procedures   Medications Ordered in ED Medications  iohexol (OMNIPAQUE) 350 MG/ML injection 100 mL (80 mLs Intravenous Contrast Given 11/02/20 1738)    ED Course  I have reviewed the triage vital signs and the nursing notes.  Pertinent labs & imaging results that were available during my care of the patient were reviewed by me and considered in my medical decision making (see chart for details).  80 year old here for evaluation of rectal pain.  Afebrile, nonseptic, not ill-appearing.  Seen here last week diagnosed with pancolitis.  Abdominal pain has resolved.  Apparently has had intermittent rectal prolapse for quite some time however episodes of prolapse are prolonging.  Apparently had a prolapsed rectum over the last 2 days. No melena or bright blood per rectum.  Heart and lungs are clear.  Abdomen soft, nontender.  No evidence of prolapsed rectum on exam.  We will plan on labs, CT scan, touch base with general surgery for follow-up.  Labs and imaging personally reviewed and interpreted:  CBC without electrolyte, renal or liver abnormality CMP sodium 134 mild elevation AST, ALT, no right upper quadrant abdominal tenderness, low suspicion for acute gallbladder, liver pathology CT AP with possible proctitis, no recurrent rectal prolapse on imaging.  Question inflammation from her recent prolapse. Per attending will hold on additional Abx.  Patient reassessed.  Abdomen soft, nontender.  Nichole Foster has been here in the emergency  department and has not had any recurrent rectal prolapse.  I discussed with Dr. Zenia Resides with general surgery.  Nichole Foster recommends follow-up outpatient as this is been a recurrent issue Nichole Foster will likely need some sort of surgical intervention.  Patient can return to ED if Nichole Foster gets recurrent rectal prolapse and unable to reduce  Patient is nontoxic, nonseptic appearing, in no apparent distress.  Patient's pain and other symptoms adequately managed in emergency department.  Fluid bolus given.  Labs, imaging and vitals reviewed.  Patient does not meet the SIRS or Sepsis criteria.  On repeat exam patient does not have a surgical abdomin and there are no peritoneal signs.  No indication of appendicitis, bowel obstruction, bowel perforation, cholecystitis, diverticulitis.   The patient has been appropriately medically screened and/or stabilized in the ED. I have low suspicion for any other emergent medical condition which would require further screening, evaluation or treatment in the ED or require inpatient management.  Patient is hemodynamically stable and in no acute distress.  Patient able to ambulate in department prior to ED.  Evaluation does not show acute pathology that would require ongoing or additional emergent interventions while in the emergency department or further inpatient treatment.  I have discussed the diagnosis with the patient and answered all questions.  Pain is been managed while in the emergency department and patient has no further complaints prior to discharge.  Patient is comfortable with plan discussed in room and is stable for discharge at this time.  I have discussed strict return precautions for returning to the emergency department.  Patient was encouraged to follow-up with PCP/specialist refer to at discharge.   Patient seen and evaluated by attending, Dr. Dina Rich who agrees with above treatment, plan and disposition   MDM Rules/Calculators/A&P  Final  Clinical Impression(s) / ED Diagnoses Final diagnoses:  Rectal pain    Rx / DC Orders ED Discharge Orders     None            Izmael Duross A, PA-C 11/03/20 0021    Horton, Alvin Critchley, DO 11/04/20 1551

## 2020-11-02 NOTE — Discharge Instructions (Addendum)
Follow-up with the general surgeons.  You will need to call to schedule appointment.  The number is listed on the discharge paperwork.  Try not to strain on the toilet.  Return for new or worsening symptoms

## 2020-11-02 NOTE — ED Triage Notes (Signed)
Patient 's husband reports that the patient has a prolapsed anus and is having oozing bright red blood even when not having a BM.

## 2020-11-22 DIAGNOSIS — F411 Generalized anxiety disorder: Secondary | ICD-10-CM | POA: Diagnosis not present

## 2020-11-22 DIAGNOSIS — F315 Bipolar disorder, current episode depressed, severe, with psychotic features: Secondary | ICD-10-CM | POA: Diagnosis not present

## 2020-11-22 DIAGNOSIS — F4310 Post-traumatic stress disorder, unspecified: Secondary | ICD-10-CM | POA: Diagnosis not present

## 2020-12-06 DIAGNOSIS — F319 Bipolar disorder, unspecified: Secondary | ICD-10-CM | POA: Diagnosis not present

## 2020-12-06 DIAGNOSIS — N1831 Chronic kidney disease, stage 3a: Secondary | ICD-10-CM | POA: Diagnosis not present

## 2020-12-06 DIAGNOSIS — E039 Hypothyroidism, unspecified: Secondary | ICD-10-CM | POA: Diagnosis not present

## 2020-12-06 DIAGNOSIS — I129 Hypertensive chronic kidney disease with stage 1 through stage 4 chronic kidney disease, or unspecified chronic kidney disease: Secondary | ICD-10-CM | POA: Diagnosis not present

## 2020-12-06 DIAGNOSIS — E785 Hyperlipidemia, unspecified: Secondary | ICD-10-CM | POA: Diagnosis not present

## 2020-12-06 DIAGNOSIS — M810 Age-related osteoporosis without current pathological fracture: Secondary | ICD-10-CM | POA: Diagnosis not present

## 2020-12-10 DIAGNOSIS — K623 Rectal prolapse: Secondary | ICD-10-CM | POA: Diagnosis not present

## 2020-12-25 DIAGNOSIS — D649 Anemia, unspecified: Secondary | ICD-10-CM | POA: Diagnosis not present

## 2020-12-25 DIAGNOSIS — K623 Rectal prolapse: Secondary | ICD-10-CM | POA: Diagnosis not present

## 2020-12-28 DIAGNOSIS — D649 Anemia, unspecified: Secondary | ICD-10-CM | POA: Diagnosis not present

## 2021-01-01 DIAGNOSIS — K5730 Diverticulosis of large intestine without perforation or abscess without bleeding: Secondary | ICD-10-CM | POA: Diagnosis not present

## 2021-01-01 DIAGNOSIS — K3189 Other diseases of stomach and duodenum: Secondary | ICD-10-CM | POA: Diagnosis not present

## 2021-01-01 DIAGNOSIS — K2930 Chronic superficial gastritis without bleeding: Secondary | ICD-10-CM | POA: Diagnosis not present

## 2021-01-01 DIAGNOSIS — K2900 Acute gastritis without bleeding: Secondary | ICD-10-CM | POA: Diagnosis not present

## 2021-01-01 DIAGNOSIS — K640 First degree hemorrhoids: Secondary | ICD-10-CM | POA: Diagnosis not present

## 2021-01-01 DIAGNOSIS — D509 Iron deficiency anemia, unspecified: Secondary | ICD-10-CM | POA: Diagnosis not present

## 2021-01-04 DIAGNOSIS — K2930 Chronic superficial gastritis without bleeding: Secondary | ICD-10-CM | POA: Diagnosis not present

## 2021-01-04 DIAGNOSIS — F319 Bipolar disorder, unspecified: Secondary | ICD-10-CM | POA: Diagnosis not present

## 2021-01-04 DIAGNOSIS — M810 Age-related osteoporosis without current pathological fracture: Secondary | ICD-10-CM | POA: Diagnosis not present

## 2021-01-04 DIAGNOSIS — D509 Iron deficiency anemia, unspecified: Secondary | ICD-10-CM | POA: Diagnosis not present

## 2021-01-04 DIAGNOSIS — I129 Hypertensive chronic kidney disease with stage 1 through stage 4 chronic kidney disease, or unspecified chronic kidney disease: Secondary | ICD-10-CM | POA: Diagnosis not present

## 2021-01-04 DIAGNOSIS — N1831 Chronic kidney disease, stage 3a: Secondary | ICD-10-CM | POA: Diagnosis not present

## 2021-01-04 DIAGNOSIS — E039 Hypothyroidism, unspecified: Secondary | ICD-10-CM | POA: Diagnosis not present

## 2021-01-04 DIAGNOSIS — E785 Hyperlipidemia, unspecified: Secondary | ICD-10-CM | POA: Diagnosis not present

## 2021-01-21 DIAGNOSIS — E7800 Pure hypercholesterolemia, unspecified: Secondary | ICD-10-CM | POA: Diagnosis present

## 2021-01-21 DIAGNOSIS — E079 Disorder of thyroid, unspecified: Secondary | ICD-10-CM | POA: Diagnosis present

## 2021-01-21 DIAGNOSIS — M810 Age-related osteoporosis without current pathological fracture: Secondary | ICD-10-CM | POA: Insufficient documentation

## 2021-01-21 DIAGNOSIS — I10 Essential (primary) hypertension: Secondary | ICD-10-CM | POA: Diagnosis present

## 2021-01-21 DIAGNOSIS — F410 Panic disorder [episodic paroxysmal anxiety] without agoraphobia: Secondary | ICD-10-CM | POA: Insufficient documentation

## 2021-01-22 ENCOUNTER — Ambulatory Visit: Payer: Self-pay | Admitting: General Surgery

## 2021-01-22 DIAGNOSIS — K623 Rectal prolapse: Secondary | ICD-10-CM | POA: Diagnosis not present

## 2021-01-22 NOTE — H&P (Signed)
PROVIDER:  Monico Blitz, MD  MRN: [id number] DOB: 08-07-1940 DATE OF ENCOUNTER: 01/22/2021 Subjective    History of Present Illness:  Nichole Foster is a 80 y.o. female who is seen today in follow up for evaluation of Rectal Prolapse.  Patient reports rectal prolapse since June 2022.  This occurs with bowel movements.  It reduces spontaneously.  Is associated with occasional rectal bleeding.  She is currently incontinent to stool.  Patient's family report a longstanding history of constipation that is currently controlled with stool softeners and MiraLAX and a high-fiber diet.  Patient recently completed a colonoscopy, which was normal except for the rectal prolapse.  Past Medical History:  Diagnosis Date   Heart murmur    High cholesterol    Hypertension    Osteoporosis    Panic attack    Stroke [facility])    Thyroid disease    Past Surgical History:  Procedure Laterality Date   CATARACT EXTRACTION     No family history on file. Social History   Socioeconomic History   Marital status: Married    Spouse name: Not on file   Number of children: Not on file   Years of education: Not on file   Highest education level: Not on file  Occupational History   Not on file  Tobacco Use   Smoking status: Never   Smokeless tobacco: Never  Vaping Use   Vaping Use: Never used  Substance and Sexual Activity   Alcohol use: No   Drug use: No   Sexual activity: Not on file  Other Topics Concern   Not on file  Social History Narrative   Not on file   Social Determinants of Health   Financial Resource Strain: Not on file  Food Insecurity: Not on file  Transportation Needs: Not on file  Physical Activity: Not on file  Stress: Not on file  Social Connections: Not on file  Intimate Partner Violence: Not on file    Current Outpatient Medications:    alendronate (FOSAMAX) 70 MG tablet, Take 70 mg by mouth every 7 (seven) days. Take with a full glass of water on an empty  stomach., Disp: , Rfl:    amLODipine (NORVASC) 10 MG tablet, Take 10 mg by mouth daily., Disp: , Rfl:    Calcium Carbonate-Vitamin D (CALCIUM 600 + D PO), Take 1 tablet by mouth daily., Disp: , Rfl:    CRANBERRY PO, Take 500 mg by mouth daily., Disp: , Rfl:    dipyridamole-aspirin (AGGRENOX) 200-25 MG per 12 hr capsule, Take 1 capsule by mouth 2 (two) times daily., Disp: , Rfl:    docusate sodium (COLACE) 100 MG capsule, Take 100 mg by mouth 2 (two) times daily., Disp: , Rfl:    ESTRACE VAGINAL 0.1 MG/GM vaginal cream, Place 2 g vaginally daily as needed (dryness)., Disp: , Rfl:    fexofenadine (ALLEGRA) 180 MG tablet, Take 90 mg by mouth daily., Disp: , Rfl:    FLUoxetine (PROZAC) 10 MG capsule, Take 1 capsule (10 mg total) by mouth daily. (Patient not taking: No sig reported), Disp: 30 capsule, Rfl: 3   fluticasone (FLONASE) 50 MCG/ACT nasal spray, Place 2 sprays into the nose daily., Disp: , Rfl:    hydrocortisone (ANUSOL-HC) 2.5 % rectal cream, Place 1 application rectally 2 (two) times daily., Disp: 30 g, Rfl: 0   IRON PO, Take 45 mg by mouth every evening., Disp: , Rfl:    lamoTRIgine (LAMICTAL) 25 MG tablet, Take 50  mg by mouth every evening., Disp: , Rfl:    levothyroxine (SYNTHROID, LEVOTHROID) 50 MCG tablet, Take 50 mcg by mouth daily before breakfast., Disp: , Rfl:    lisinopril (PRINIVIL,ZESTRIL) 20 MG tablet, Take 1 tablet (20 mg total) by mouth daily. (Patient not taking: Reported on 10/22/2020), Disp: 30 tablet, Rfl: 0   lisinopril (ZESTRIL) 40 MG tablet, Take 40 mg by mouth every morning., Disp: , Rfl:    Multiple Vitamin (MULTIVITAMIN WITH MINERALS) TABS tablet, Take 1 tablet by mouth daily., Disp: , Rfl:    Omega-3 Fatty Acids (FISH OIL) 1200 MG CAPS, Take 1 capsule by mouth in the morning and at bedtime., Disp: , Rfl:    polyethylene glycol (MIRALAX / GLYCOLAX) packet, Take 17 g by mouth daily as needed. (Patient taking differently: Take 17 g by mouth daily.), Disp: 14 each, Rfl:  0   QUEtiapine (SEROQUEL) 100 MG tablet, Take 100 mg by mouth at bedtime., Disp: , Rfl:    risperiDONE (RISPERDAL M-TABS) 0.5 MG disintegrating tablet, Take 1 tablet (0.5 mg total) by mouth 2 (two) times daily. (Patient not taking: No sig reported), Disp: , Rfl:    senna-docusate (SENOKOT-S) 8.6-50 MG per tablet, Take 2 tablets by mouth 2 (two) times daily. (Patient not taking: No sig reported), Disp: , Rfl:    simvastatin (ZOCOR) 20 MG tablet, Take 20 mg by mouth every evening., Disp: , Rfl:    Allergies  Allergen Reactions   Sulfa Antibiotics Hives   Latex Rash    "to privates"    Review of Systems - General ROS: negative Respiratory ROS: no cough, shortness of breath, or wheezing Cardiovascular ROS: no chest pain or dyspnea on exertion Gastrointestinal ROS: no abdominal pain, change in bowel habits, or black or bloody stools Genito-Urinary ROS: no dysuria, trouble voiding, or hematuria    Objective:    General appearance - alert, well appearing, and in no distress Chest - clear to auscultation, no wheezes, rales or rhonchi, symmetric air entry Heart - normal rate and regular rhythm Abdomen - soft, nontender, nondistended, no masses or organomegaly   Labs, Imaging and Diagnostic Testing:  Colonoscopy report reviewed.  Most recent CT personally reviewed.    Assessment and Plan:  Diagnoses and all orders for this visit:  Rectal prolapse  Patient with rectal prolapse.  We discussed a robotic assisted rectopexy without resection.  We discussed that her constipation could worsen after surgery.  We discussed that bladder function may be worse after surgery.  We discussed that patient's incontinence may be worse after surgery and may not improve.  We discussed the need for adequate bowel regimen after surgery to avoid constipation.  We discussed the risk of bleeding and damage to adjacent structures.  All questions were answered.  The surgery and anatomy were described to the  patient as well as the risks of surgery and the possible complications.  These include: Bleeding, deep abdominal infections and possible wound complications such as hernia and infection, damage to adjacent structures, leak of surgical connections, which can lead to other surgeries and possibly an ostomy, possible need for other procedures, such as abscess drains in radiology, possible prolonged hospital stay, possible diarrhea from removal of part of the colon, possible constipation from narcotics, possible bowel, bladder or sexual dysfunction if having rectal surgery, prolonged fatigue/weakness or appetite loss, possible early recurrence of of disease, possible complications of their medical problems such as heart disease or arrhythmias or lung problems, death (less than 1%). I believe  the patient understands and wishes to proceed with the surgery.

## 2021-02-04 DIAGNOSIS — Z23 Encounter for immunization: Secondary | ICD-10-CM | POA: Diagnosis not present

## 2021-02-04 DIAGNOSIS — D509 Iron deficiency anemia, unspecified: Secondary | ICD-10-CM | POA: Diagnosis not present

## 2021-02-04 DIAGNOSIS — Z8673 Personal history of transient ischemic attack (TIA), and cerebral infarction without residual deficits: Secondary | ICD-10-CM | POA: Diagnosis not present

## 2021-02-04 DIAGNOSIS — N1831 Chronic kidney disease, stage 3a: Secondary | ICD-10-CM | POA: Diagnosis not present

## 2021-02-04 DIAGNOSIS — K623 Rectal prolapse: Secondary | ICD-10-CM | POA: Diagnosis not present

## 2021-02-04 DIAGNOSIS — F319 Bipolar disorder, unspecified: Secondary | ICD-10-CM | POA: Diagnosis not present

## 2021-02-04 DIAGNOSIS — Z01818 Encounter for other preprocedural examination: Secondary | ICD-10-CM | POA: Diagnosis not present

## 2021-02-04 DIAGNOSIS — I129 Hypertensive chronic kidney disease with stage 1 through stage 4 chronic kidney disease, or unspecified chronic kidney disease: Secondary | ICD-10-CM | POA: Diagnosis not present

## 2021-02-04 DIAGNOSIS — E039 Hypothyroidism, unspecified: Secondary | ICD-10-CM | POA: Diagnosis not present

## 2021-02-04 DIAGNOSIS — M810 Age-related osteoporosis without current pathological fracture: Secondary | ICD-10-CM | POA: Diagnosis not present

## 2021-02-04 DIAGNOSIS — R0989 Other specified symptoms and signs involving the circulatory and respiratory systems: Secondary | ICD-10-CM | POA: Diagnosis not present

## 2021-02-04 DIAGNOSIS — E785 Hyperlipidemia, unspecified: Secondary | ICD-10-CM | POA: Diagnosis not present

## 2021-02-05 DIAGNOSIS — I129 Hypertensive chronic kidney disease with stage 1 through stage 4 chronic kidney disease, or unspecified chronic kidney disease: Secondary | ICD-10-CM | POA: Diagnosis not present

## 2021-02-07 DIAGNOSIS — K623 Rectal prolapse: Secondary | ICD-10-CM | POA: Diagnosis not present

## 2021-02-07 DIAGNOSIS — D509 Iron deficiency anemia, unspecified: Secondary | ICD-10-CM | POA: Diagnosis not present

## 2021-02-21 NOTE — Patient Instructions (Signed)
DUE TO COVID-19 ONLY ONE VISITOR IS ALLOWED TO COME WITH YOU AND STAY IN THE WAITING ROOM ONLY DURING PRE OP AND PROCEDURE.   **NO VISITORS ARE ALLOWED IN THE SHORT STAY AREA OR RECOVERY ROOM!!**  IF YOU WILL BE ADMITTED INTO THE HOSPITAL YOU ARE ALLOWED ONLY TWO SUPPORT PEOPLE DURING VISITATION HOURS ONLY (7 AM -8PM)   The support person(s) must pass our screening, gel in and out, and wear a mask at all times, including in the patient's room. Patients must also wear a mask when staff or their support person are in the room. Visitors GUEST BADGE MUST BE WORN VISIBLY  One adult visitor may remain with you overnight and MUST be in the room by 8 P.M.  No visitors under the age of 96. Any visitor under the age of 31 must be accompanied by an adult.    COVID SWAB TESTING MUST BE COMPLETED ON:  03/13/21 **MUST PRESENT COMPLETED FORM AT TESTING SITE**     8A - 3P    [place] (backside of the building) You are not required to quarantine, however you are required to wear a well-fitted mask when you are out and around people not in your household.  Hand Hygiene often Do NOT share personal items Notify your provider if you are in close contact with someone who has COVID or you develop fever 100.4 or greater, new onset of sneezing, cough, sore throat, shortness of breath or body aches.  [place], [address], must go inside of the hospital, NOT A DRIVE THRU!  (Must self quarantine after testing. Follow instructions on handout.)       Your procedure is scheduled on: 03/15/21   Report to [facility] Main Entrance    Report to admitting at : 9:15 AM   Call this number if you have problems the morning of surgery [phone]   DRINK 2 PRESURGERY ENSURE DRINKS THE NIGHT BEFORE SURGERY AT  1000 PM AND 1 PRESURGERY DRINK THE DAY OF THE PROCEDURE 3 HOURS PRIOR TO SCHEDULED SURGERY. NO SOLIDS AFTER MIDNIGHT  THE DAY PRIOR TO THE SURGERY. NOTHING BY MOUTH EXCEPT CLEAR LIQUIDS UNTIL THREE HOURS PRIOR TO SCHEDULED SURGERY. PLEASE FINISH PRESURGERY ENSURE DRINK PER SURGEON ORDER 3 HOURS PRIOR TO SCHEDULED SURGERY TIME WHICH NEEDS TO BE COMPLETED AT : 8:30 AM.    May have liquids until: 8:30 AM    day of surgery  CLEAR LIQUID DIET: Starting the day of the prep.  Foods Allowed                                                                     Foods Excluded  Water, Black Coffee and tea, regular and decaf                             liquids that you cannot  Plain Jell-O in any flavor  (No red)  see through such as: Fruit ices (not with fruit pulp)                                     milk, soups, orange juice              Iced Popsicles (No red)                                    All solid food                                   Apple juices Sports drinks like Gatorade (No red) Lightly seasoned clear broth or consume(fat free) Sugar  Sample Menu Breakfast                                Lunch                                     Supper Cranberry juice                    Beef broth                            Chicken broth Jell-O                                     Grape juice                           Apple juice Coffee or tea                        Jell-O                                      Popsicle                                                Coffee or tea                        Coffee or tea       Oral Hygiene is also important to reduce your risk of infection.                                    Remember - BRUSH YOUR TEETH THE MORNING OF SURGERY WITH YOUR REGULAR TOOTHPASTE   Do NOT smoke after Midnight   Take these medicines the morning of surgery with A SIP OF WATER: lamotrigine,levocetirizine,amlodipine,synthroid                      You may not have  any metal on your body including hair pins, jewelry, and body piercing             Do not wear  make-up, lotions, powders, perfumes/cologne, or deodorant  Do not wear nail polish including gel and S&S, artificial/acrylic nails, or any other type of covering on natural nails including finger and toenails. If you have artificial nails, gel coating, etc. that needs to be removed by a nail salon please have this removed prior to surgery or surgery may need to be canceled/ delayed if the surgeon/ anesthesia feels like they are unable to be safely monitored.   Do not shave  48 hours prior to surgery.    Do not bring valuables to the hospital. [place].   Contacts, dentures or bridgework may not be worn into surgery.   Bring small overnight bag day of surgery.    Patients discharged on the day of surgery will not be allowed to drive home.   Special Instructions: Bring a copy of your healthcare power of attorney and living will documents         the day of surgery if you haven't scanned them before.              Please read over the following fact sheets you were given: IF YOU HAVE QUESTIONS ABOUT YOUR PRE-OP INSTRUCTIONS PLEASE CALL [phone]     [facility] - Preparing for Surgery Before surgery, you can play an important role.  Because skin is not sterile, your skin needs to be as free of germs as possible.  You can reduce the number of germs on your skin by washing with CHG (chlorahexidine gluconate) soap before surgery.  CHG is an antiseptic cleaner which kills germs and bonds with the skin to continue killing germs even after washing. Please DO NOT use if you have an allergy to CHG or antibacterial soaps.  If your skin becomes reddened/irritated stop using the CHG and inform your nurse when you arrive at Short Stay. Do not shave (including legs and underarms) for at least 48 hours prior to the first CHG shower.  You may shave your face/neck. Please follow these instructions carefully:  1.  Shower with CHG Soap the night before surgery  and the  morning of Surgery.  2.  If you choose to wash your hair, wash your hair first as usual with your  normal  shampoo.  3.  After you shampoo, rinse your hair and body thoroughly to remove the  shampoo.                           4.  Use CHG as you would any other liquid soap.  You can apply chg directly  to the skin and wash                       Gently with a scrungie or clean washcloth.  5.  Apply the CHG Soap to your body ONLY FROM THE NECK DOWN.   Do not use on face/ open                           Wound or open sores. Avoid contact with eyes, ears mouth and genitals (private parts).  Wash face,  Genitals (private parts) with your normal soap.             6.  Wash thoroughly, paying special attention to the area where your surgery  will be performed.  7.  Thoroughly rinse your body with warm water from the neck down.  8.  DO NOT shower/wash with your normal soap after using and rinsing off  the CHG Soap.                9.  Pat yourself dry with a clean towel.            10.  Wear clean pajamas.            11.  Place clean sheets on your bed the night of your first shower and do not  sleep with pets. Day of Surgery : Do not apply any lotions/deodorants the morning of surgery.  Please wear clean clothes to the hospital/surgery center.  FAILURE TO FOLLOW THESE INSTRUCTIONS MAY RESULT IN THE CANCELLATION OF YOUR SURGERY PATIENT SIGNATURE_________________________________  NURSE SIGNATURE__________________________________  ________________________________________________________________________   Adam Phenix  An incentive spirometer is a tool that can help keep your lungs clear and active. This tool measures how well you are filling your lungs with each breath. Taking long deep breaths may help reverse or decrease the chance of developing breathing (pulmonary) problems (especially infection) following: A long period of time when you are unable to move or be  active. BEFORE THE PROCEDURE  If the spirometer includes an indicator to show your best effort, your nurse or respiratory therapist will set it to a desired goal. If possible, sit up straight or lean slightly forward. Try not to slouch. Hold the incentive spirometer in an upright position. INSTRUCTIONS FOR USE  Sit on the edge of your bed if possible, or sit up as far as you can in bed or on a chair. Hold the incentive spirometer in an upright position. Breathe out normally. Place the mouthpiece in your mouth and seal your lips tightly around it. Breathe in slowly and as deeply as possible, raising the piston or the ball toward the top of the column. Hold your breath for 3-5 seconds or for as long as possible. Allow the piston or ball to fall to the bottom of the column. Remove the mouthpiece from your mouth and breathe out normally. Rest for a few seconds and repeat Steps 1 through 7 at least 10 times every 1-2 hours when you are awake. Take your time and take a few normal breaths between deep breaths. The spirometer may include an indicator to show your best effort. Use the indicator as a goal to work toward during each repetition. After each set of 10 deep breaths, practice coughing to be sure your lungs are clear. If you have an incision (the cut made at the time of surgery), support your incision when coughing by placing a pillow or rolled up towels firmly against it. Once you are able to get out of bed, walk around indoors and cough well. You may stop using the incentive spirometer when instructed by your caregiver.  RISKS AND COMPLICATIONS Take your time so you do not get dizzy or light-headed. If you are in pain, you may need to take or ask for pain medication before doing incentive spirometry. It is harder to take a deep breath if you are having pain. AFTER USE Rest and breathe slowly and easily. It can be helpful to keep track of  a log of your progress. Your caregiver can provide you  with a simple table to help with this. If you are using the spirometer at home, follow these instructions: [place] IF:  You are having difficultly using the spirometer. You have trouble using the spirometer as often as instructed. Your pain medication is not giving enough relief while using the spirometer. You develop fever of 100.5 F (38.1 C) or higher. SEEK IMMEDIATE MEDICAL CARE IF:  You cough up bloody sputum that had not been present before. You develop fever of 102 F (38.9 C) or greater. You develop worsening pain at or near the incision site. MAKE SURE YOU:  Understand these instructions. Will watch your condition. Will get help right away if you are not doing well or get worse. Document Released: 08/25/2006 Document Revised: 07/07/2011 Document Reviewed: 10/26/2006 [facility] Patient Information [address], [place].   ________________________________________________________________________

## 2021-02-22 ENCOUNTER — Encounter (INDEPENDENT_AMBULATORY_CARE_PROVIDER_SITE_OTHER): Payer: Self-pay

## 2021-02-22 ENCOUNTER — Encounter (HOSPITAL_COMMUNITY)
Admission: RE | Admit: 2021-02-22 | Discharge: 2021-02-22 | Disposition: A | Discharge status: Home / Self Care | Payer: PPO | Source: Ambulatory Visit | Attending: General Surgery | Admitting: General Surgery

## 2021-02-22 ENCOUNTER — Encounter (HOSPITAL_COMMUNITY): Payer: Self-pay

## 2021-02-22 ENCOUNTER — Other Ambulatory Visit: Payer: Self-pay

## 2021-02-22 VITALS — BP 133/51 | HR 78 | Temp 98.0°F | Ht 62.0 in | Wt 122.0 lb

## 2021-02-22 DIAGNOSIS — Z01812 Encounter for preprocedural laboratory examination: Secondary | ICD-10-CM | POA: Insufficient documentation

## 2021-02-22 DIAGNOSIS — Z79899 Other long term (current) drug therapy: Secondary | ICD-10-CM | POA: Insufficient documentation

## 2021-02-22 DIAGNOSIS — I10 Essential (primary) hypertension: Secondary | ICD-10-CM

## 2021-02-22 HISTORY — DX: Malignant (primary) neoplasm, unspecified: C80.1

## 2021-02-22 HISTORY — DX: Anemia, unspecified: D64.9

## 2021-02-22 HISTORY — DX: Depression, unspecified: F32.A

## 2021-02-22 LAB — BASIC METABOLIC PANEL
Anion gap: 7 (ref 5–15)
BUN: 20 mg/dL (ref 8–23)
CO2: 29 mmol/L (ref 22–32)
Calcium: 9.4 mg/dL (ref 8.9–10.3)
Chloride: 96 mmol/L — ABNORMAL LOW (ref 98–111)
Creatinine, Ser: 0.83 mg/dL (ref 0.44–1.00)
GFR, Estimated: >60 mL/min (ref >60)
Glucose, Bld: 158 mg/dL — ABNORMAL HIGH (ref 70–99)
Potassium: 4.2 mmol/L (ref 3.5–5.1)
Sodium: 132 mmol/L — ABNORMAL LOW (ref 135–145)

## 2021-02-22 LAB — CBC
HCT: 37 % (ref 36.0–46.0)
Hemoglobin: 11.8 g/dL — ABNORMAL LOW (ref 12.0–15.0)
MCH: 29.9 pg (ref 26.0–34.0)
MCHC: 31.9 g/dL (ref 30.0–36.0)
MCV: 93.9 fL (ref 80.0–100.0)
Platelets: 308 10*3/uL (ref 150–400)
RBC: 3.94 MIL/uL (ref 3.87–5.11)
RDW: 14.2 % (ref 11.5–15.5)
WBC: 6.7 10*3/uL (ref 4.0–10.5)
nRBC: 0 % (ref 0.0–0.2)

## 2021-02-22 NOTE — Progress Notes (Addendum)
COVID Vaccine Completed: Yes Date COVID Vaccine completed: 2021 x 3 COVID vaccine manufacturer: [place] Test: 03/13/21  PCP - [facility]. Cardiologist - NO  Chest x-ray -  EKG - 10/24/20 Stress Test -  ECHO -  Cardiac Cath -  Pacemaker/ICD device last checked:  Sleep Study -  CPAP -   Fasting Blood Sugar -  Checks Blood Sugar _____ times a day  Blood Thinner Instructions:aggrenox will be held starting on 03/10/21 as per Dr. Marcello Moores instructions. Aspirin Instructions: Last Dose:  Anesthesia review: Hx: HTN,stroke,heart murmur.  Patient denies shortness of breath, fever, cough and chest pain at PAT appointment   Patient verbalized understanding of instructions that were given to them at the PAT appointment. Patient was also instructed that they will need to review over the PAT instructions again at home before surgery.

## 2021-02-27 ENCOUNTER — Other Ambulatory Visit: Payer: Self-pay | Admitting: General Surgery

## 2021-02-28 LAB — SARS CORONAVIRUS 2 (TAT 6-24 HRS): SARS Coronavirus 2: POSITIVE — AB

## 2021-02-28 NOTE — Progress Notes (Signed)
Currie Paris, RN from [place] with Dr. Marcello Moores, A. with + covid results for this pt. The pt is to have surgery on Fri 03/15/21 at 11:15 at [facility]. The pt has been notified by the [facility] staff.   Positive Results for:  Asymptomatic: Procedure postponed and quarantined for 10 days, unless the procedure is urgent. Please follow the facility's protocol regarding pt and family arrival.  Symptomatic: Procedure postponed and quarantined for 14 days.  Hospitalized with Covid: postponed and quarantined  for 21 days.  Immunocompromised: Procedure postponed and quarantine for 20 days.  The pt will not be retested for 90 days from the + result.

## 2021-03-04 DIAGNOSIS — I129 Hypertensive chronic kidney disease with stage 1 through stage 4 chronic kidney disease, or unspecified chronic kidney disease: Secondary | ICD-10-CM | POA: Diagnosis not present

## 2021-03-04 DIAGNOSIS — E440 Moderate protein-calorie malnutrition: Secondary | ICD-10-CM | POA: Diagnosis not present

## 2021-03-04 DIAGNOSIS — Z23 Encounter for immunization: Secondary | ICD-10-CM | POA: Diagnosis not present

## 2021-03-04 DIAGNOSIS — E039 Hypothyroidism, unspecified: Secondary | ICD-10-CM | POA: Diagnosis not present

## 2021-03-04 DIAGNOSIS — N1831 Chronic kidney disease, stage 3a: Secondary | ICD-10-CM | POA: Diagnosis not present

## 2021-03-04 DIAGNOSIS — M810 Age-related osteoporosis without current pathological fracture: Secondary | ICD-10-CM | POA: Diagnosis not present

## 2021-03-04 DIAGNOSIS — E871 Hypo-osmolality and hyponatremia: Secondary | ICD-10-CM | POA: Diagnosis not present

## 2021-03-04 DIAGNOSIS — E785 Hyperlipidemia, unspecified: Secondary | ICD-10-CM | POA: Diagnosis not present

## 2021-03-08 DIAGNOSIS — N1831 Chronic kidney disease, stage 3a: Secondary | ICD-10-CM | POA: Diagnosis not present

## 2021-03-08 DIAGNOSIS — I129 Hypertensive chronic kidney disease with stage 1 through stage 4 chronic kidney disease, or unspecified chronic kidney disease: Secondary | ICD-10-CM | POA: Diagnosis not present

## 2021-03-08 DIAGNOSIS — F319 Bipolar disorder, unspecified: Secondary | ICD-10-CM | POA: Diagnosis not present

## 2021-03-08 DIAGNOSIS — E039 Hypothyroidism, unspecified: Secondary | ICD-10-CM | POA: Diagnosis not present

## 2021-03-08 DIAGNOSIS — D509 Iron deficiency anemia, unspecified: Secondary | ICD-10-CM | POA: Diagnosis not present

## 2021-03-08 DIAGNOSIS — E785 Hyperlipidemia, unspecified: Secondary | ICD-10-CM | POA: Diagnosis not present

## 2021-03-08 DIAGNOSIS — M810 Age-related osteoporosis without current pathological fracture: Secondary | ICD-10-CM | POA: Diagnosis not present

## 2021-03-15 ENCOUNTER — Inpatient Hospital Stay (HOSPITAL_COMMUNITY)
Admission: RE | Admit: 2021-03-15 | Discharge: 2021-03-20 | DRG: 330 | Disposition: A | Discharge status: Home / Self Care | Payer: PPO | Attending: General Surgery | Admitting: General Surgery

## 2021-03-15 ENCOUNTER — Inpatient Hospital Stay (HOSPITAL_COMMUNITY): Payer: PPO | Admitting: Anesthesiology

## 2021-03-15 ENCOUNTER — Encounter (HOSPITAL_COMMUNITY)
Admission: RE | Disposition: A | Discharge status: Home / Self Care | Payer: Self-pay | Source: Home / Self Care | Attending: General Surgery

## 2021-03-15 ENCOUNTER — Encounter (HOSPITAL_COMMUNITY): Payer: Self-pay | Admitting: General Surgery

## 2021-03-15 ENCOUNTER — Inpatient Hospital Stay (HOSPITAL_COMMUNITY): Payer: PPO | Admitting: Physician Assistant

## 2021-03-15 DIAGNOSIS — F410 Panic disorder [episodic paroxysmal anxiety] without agoraphobia: Secondary | ICD-10-CM | POA: Diagnosis present

## 2021-03-15 DIAGNOSIS — R32 Unspecified urinary incontinence: Secondary | ICD-10-CM | POA: Diagnosis not present

## 2021-03-15 DIAGNOSIS — K623 Rectal prolapse: Principal | ICD-10-CM | POA: Diagnosis present

## 2021-03-15 DIAGNOSIS — Q438 Other specified congenital malformations of intestine: Secondary | ICD-10-CM | POA: Diagnosis not present

## 2021-03-15 DIAGNOSIS — Z91040 Latex allergy status: Secondary | ICD-10-CM

## 2021-03-15 DIAGNOSIS — R159 Full incontinence of feces: Secondary | ICD-10-CM | POA: Diagnosis present

## 2021-03-15 DIAGNOSIS — K567 Ileus, unspecified: Secondary | ICD-10-CM | POA: Diagnosis not present

## 2021-03-15 DIAGNOSIS — I10 Essential (primary) hypertension: Secondary | ICD-10-CM | POA: Diagnosis present

## 2021-03-15 DIAGNOSIS — E7800 Pure hypercholesterolemia, unspecified: Secondary | ICD-10-CM | POA: Diagnosis not present

## 2021-03-15 DIAGNOSIS — K5900 Constipation, unspecified: Secondary | ICD-10-CM | POA: Diagnosis present

## 2021-03-15 DIAGNOSIS — Z20822 Contact with and (suspected) exposure to covid-19: Secondary | ICD-10-CM | POA: Diagnosis present

## 2021-03-15 DIAGNOSIS — K625 Hemorrhage of anus and rectum: Secondary | ICD-10-CM | POA: Diagnosis not present

## 2021-03-15 DIAGNOSIS — Z8673 Personal history of transient ischemic attack (TIA), and cerebral infarction without residual deficits: Secondary | ICD-10-CM | POA: Diagnosis not present

## 2021-03-15 DIAGNOSIS — M810 Age-related osteoporosis without current pathological fracture: Secondary | ICD-10-CM | POA: Diagnosis present

## 2021-03-15 DIAGNOSIS — D649 Anemia, unspecified: Secondary | ICD-10-CM | POA: Diagnosis not present

## 2021-03-15 DIAGNOSIS — Z882 Allergy status to sulfonamides status: Secondary | ICD-10-CM | POA: Diagnosis not present

## 2021-03-15 DIAGNOSIS — E039 Hypothyroidism, unspecified: Secondary | ICD-10-CM | POA: Diagnosis present

## 2021-03-15 DIAGNOSIS — F32A Depression, unspecified: Secondary | ICD-10-CM | POA: Diagnosis not present

## 2021-03-15 DIAGNOSIS — F418 Other specified anxiety disorders: Secondary | ICD-10-CM | POA: Diagnosis not present

## 2021-03-15 HISTORY — PX: XI ROBOT ASSISTED RECTOPEXY: SHX6788

## 2021-03-15 SURGERY — RECTOPEXY, ROBOT-ASSISTED
Anesthesia: General | Site: Abdomen

## 2021-03-15 MED ORDER — ONDANSETRON HCL 4 MG PO TABS: 4.0000 mg | ORAL_TABLET | Freq: Four times a day (QID) | ORAL | Status: DC | PRN

## 2021-03-15 MED ORDER — PHENYLEPHRINE 40 MCG/ML (10ML) SYRINGE FOR IV PUSH (FOR BLOOD PRESSURE SUPPORT)
PREFILLED_SYRINGE | INTRAVENOUS | Status: AC
  Filled 2021-03-15: qty 10

## 2021-03-15 MED ORDER — ACETAMINOPHEN 500 MG PO TABS: 1000.0000 mg | ORAL_TABLET | Freq: Once | ORAL | Status: DC

## 2021-03-15 MED ORDER — BUPIVACAINE LIPOSOME 1.3 % IJ SUSP
INTRAMUSCULAR | Status: AC
  Filled 2021-03-15: qty 20

## 2021-03-15 MED ORDER — OXYCODONE HCL 5 MG/5ML PO SOLN: 5.0000 mg | Freq: Once | ORAL | Status: AC | PRN

## 2021-03-15 MED ORDER — FENTANYL CITRATE (PF) 250 MCG/5ML IJ SOLN
INTRAMUSCULAR | Status: AC
  Filled 2021-03-15: qty 5

## 2021-03-15 MED ORDER — ONDANSETRON HCL 4 MG/2ML IJ SOLN
INTRAMUSCULAR | Status: DC | PRN
  Administered 2021-03-15: 4 mg via INTRAVENOUS

## 2021-03-15 MED ORDER — PROPOFOL 10 MG/ML IV BOLUS
INTRAVENOUS | Status: DC | PRN
  Administered 2021-03-15: 70 mg via INTRAVENOUS

## 2021-03-15 MED ORDER — LISINOPRIL 20 MG PO TABS
40.0000 mg | ORAL_TABLET | Freq: Every day | ORAL | Status: DC
  Administered 2021-03-16 – 2021-03-20 (×5): 40 mg via ORAL
  Filled 2021-03-15 (×5): qty 2

## 2021-03-15 MED ORDER — DIPHENHYDRAMINE HCL 50 MG/ML IJ SOLN: 12.5000 mg | Freq: Four times a day (QID) | INTRAMUSCULAR | Status: DC | PRN

## 2021-03-15 MED ORDER — AMLODIPINE BESYLATE 10 MG PO TABS
10.0000 mg | ORAL_TABLET | Freq: Every day | ORAL | Status: DC
  Administered 2021-03-16 – 2021-03-20 (×5): 10 mg via ORAL
  Filled 2021-03-15 (×5): qty 1

## 2021-03-15 MED ORDER — ROCURONIUM BROMIDE 10 MG/ML (PF) SYRINGE
PREFILLED_SYRINGE | INTRAVENOUS | Status: DC | PRN
  Administered 2021-03-15: 70 mg via INTRAVENOUS

## 2021-03-15 MED ORDER — AMISULPRIDE (ANTIEMETIC) 5 MG/2ML IV SOLN
INTRAVENOUS | Status: AC
  Administered 2021-03-15: 10 mg via INTRAVENOUS
  Filled 2021-03-15: qty 4

## 2021-03-15 MED ORDER — SUGAMMADEX SODIUM 200 MG/2ML IV SOLN
INTRAVENOUS | Status: DC | PRN
Start: 2021-03-15 — End: 2021-03-15
  Administered 2021-03-15: 200 mg via INTRAVENOUS

## 2021-03-15 MED ORDER — OYSTER SHELL CALCIUM/D3 500-5 MG-MCG PO TABS
1.0000 | ORAL_TABLET | Freq: Every day | ORAL | Status: DC
  Administered 2021-03-16 – 2021-03-20 (×5): 1 via ORAL
  Filled 2021-03-15 (×5): qty 1

## 2021-03-15 MED ORDER — HYDROMORPHONE HCL 1 MG/ML IJ SOLN: 0.5000 mg | INTRAMUSCULAR | Status: DC | PRN

## 2021-03-15 MED ORDER — FENTANYL CITRATE (PF) 250 MCG/5ML IJ SOLN
INTRAMUSCULAR | Status: DC | PRN
  Administered 2021-03-15 (×5): 50 ug via INTRAVENOUS

## 2021-03-15 MED ORDER — ALVIMOPAN 12 MG PO CAPS: 12.0000 mg | ORAL_CAPSULE | Freq: Two times a day (BID) | ORAL | Status: DC

## 2021-03-15 MED ORDER — ENSURE PRE-SURGERY PO LIQD
592.0000 mL | Freq: Once | ORAL | Status: DC
  Filled 2021-03-15: qty 592

## 2021-03-15 MED ORDER — CHLORHEXIDINE GLUCONATE 0.12 % MT SOLN
15.0000 mL | Freq: Once | OROMUCOSAL | Status: AC
  Administered 2021-03-15: 15 mL via OROMUCOSAL

## 2021-03-15 MED ORDER — SIMVASTATIN 20 MG PO TABS
20.0000 mg | ORAL_TABLET | Freq: Every evening | ORAL | Status: DC
  Administered 2021-03-15 – 2021-03-19 (×5): 20 mg via ORAL
  Filled 2021-03-15 (×5): qty 1

## 2021-03-15 MED ORDER — ENOXAPARIN SODIUM 40 MG/0.4ML IJ SOSY
40.0000 mg | PREFILLED_SYRINGE | INTRAMUSCULAR | Status: DC
  Administered 2021-03-16 – 2021-03-20 (×5): 40 mg via SUBCUTANEOUS
  Filled 2021-03-15 (×5): qty 0.4

## 2021-03-15 MED ORDER — LAMOTRIGINE 25 MG PO TABS
50.0000 mg | ORAL_TABLET | Freq: Every day | ORAL | Status: DC
  Administered 2021-03-16 – 2021-03-20 (×5): 50 mg via ORAL
  Filled 2021-03-15 (×5): qty 2

## 2021-03-15 MED ORDER — BUPIVACAINE-EPINEPHRINE 0.25% -1:200000 IJ SOLN
INTRAMUSCULAR | Status: DC | PRN
  Administered 2021-03-15: 30 mL

## 2021-03-15 MED ORDER — BUPIVACAINE-EPINEPHRINE (PF) 0.25% -1:200000 IJ SOLN
INTRAMUSCULAR | Status: AC
  Filled 2021-03-15: qty 30

## 2021-03-15 MED ORDER — DOCUSATE SODIUM 100 MG PO CAPS
100.0000 mg | ORAL_CAPSULE | Freq: Two times a day (BID) | ORAL | Status: DC
  Administered 2021-03-15 – 2021-03-20 (×9): 100 mg via ORAL
  Filled 2021-03-15 (×9): qty 1

## 2021-03-15 MED ORDER — DEXAMETHASONE SODIUM PHOSPHATE 10 MG/ML IJ SOLN
INTRAMUSCULAR | Status: DC | PRN
  Administered 2021-03-15: 5 mg via INTRAVENOUS

## 2021-03-15 MED ORDER — ENSURE SURGERY PO LIQD
237.0000 mL | Freq: Two times a day (BID) | ORAL | Status: DC
  Administered 2021-03-16 – 2021-03-20 (×7): 237 mL via ORAL

## 2021-03-15 MED ORDER — ONDANSETRON HCL 4 MG/2ML IJ SOLN: 4.0000 mg | Freq: Once | INTRAMUSCULAR | Status: DC | PRN

## 2021-03-15 MED ORDER — QUETIAPINE FUMARATE 50 MG PO TABS
100.0000 mg | ORAL_TABLET | Freq: Every day | ORAL | Status: DC
  Administered 2021-03-15 – 2021-03-19 (×5): 100 mg via ORAL
  Filled 2021-03-15 (×5): qty 2

## 2021-03-15 MED ORDER — BUPIVACAINE LIPOSOME 1.3 % IJ SUSP
INTRAMUSCULAR | Status: DC | PRN
  Administered 2021-03-15: 20 mL

## 2021-03-15 MED ORDER — ENSURE PRE-SURGERY PO LIQD
296.0000 mL | Freq: Once | ORAL | Status: DC
  Filled 2021-03-15: qty 296

## 2021-03-15 MED ORDER — ORAL CARE MOUTH RINSE: 15.0000 mL | Freq: Once | OROMUCOSAL | Status: AC

## 2021-03-15 MED ORDER — FENTANYL CITRATE PF 50 MCG/ML IJ SOSY
PREFILLED_SYRINGE | INTRAMUSCULAR | Status: AC
  Administered 2021-03-15: 25 ug via INTRAVENOUS
  Filled 2021-03-15: qty 2

## 2021-03-15 MED ORDER — SACCHAROMYCES BOULARDII 250 MG PO CAPS
250.0000 mg | ORAL_CAPSULE | Freq: Two times a day (BID) | ORAL | Status: DC
  Administered 2021-03-15 – 2021-03-20 (×10): 250 mg via ORAL
  Filled 2021-03-15 (×11): qty 1

## 2021-03-15 MED ORDER — ALUM & MAG HYDROXIDE-SIMETH 200-200-20 MG/5ML PO SUSP
30.0000 mL | Freq: Four times a day (QID) | ORAL | Status: DC | PRN
  Administered 2021-03-16: 30 mL via ORAL
  Filled 2021-03-15: qty 30

## 2021-03-15 MED ORDER — OXYCODONE HCL 5 MG PO TABS: 5.0000 mg | ORAL_TABLET | Freq: Once | ORAL | Status: AC | PRN

## 2021-03-15 MED ORDER — HEPARIN SODIUM (PORCINE) 5000 UNIT/ML IJ SOLN
5000.0000 [IU] | Freq: Once | INTRAMUSCULAR | Status: AC
  Administered 2021-03-15: 5000 [IU] via SUBCUTANEOUS
  Filled 2021-03-15: qty 1

## 2021-03-15 MED ORDER — DEXAMETHASONE SODIUM PHOSPHATE 10 MG/ML IJ SOLN
INTRAMUSCULAR | Status: AC
  Filled 2021-03-15: qty 1

## 2021-03-15 MED ORDER — BUPIVACAINE LIPOSOME 1.3 % IJ SUSP: 20.0000 mL | Freq: Once | INTRAMUSCULAR | Status: DC

## 2021-03-15 MED ORDER — PHENYLEPHRINE HCL-NACL 20-0.9 MG/250ML-% IV SOLN
INTRAVENOUS | Status: DC | PRN
  Administered 2021-03-15: 35 ug/min via INTRAVENOUS

## 2021-03-15 MED ORDER — TRAMADOL HCL 50 MG PO TABS
50.0000 mg | ORAL_TABLET | Freq: Four times a day (QID) | ORAL | Status: DC | PRN
  Administered 2021-03-16 – 2021-03-18 (×3): 50 mg via ORAL
  Filled 2021-03-15 (×3): qty 1

## 2021-03-15 MED ORDER — LIDOCAINE HCL (PF) 2 % IJ SOLN
INTRAMUSCULAR | Status: AC
  Filled 2021-03-15: qty 5

## 2021-03-15 MED ORDER — SODIUM CHLORIDE 0.9 % IV SOLN
2.0000 g | INTRAVENOUS | Status: AC
  Administered 2021-03-15: 2 g via INTRAVENOUS
  Filled 2021-03-15: qty 2

## 2021-03-15 MED ORDER — ACETAMINOPHEN 500 MG PO TABS
1000.0000 mg | ORAL_TABLET | ORAL | Status: AC
Start: 2021-03-15 — End: 2021-03-15
  Administered 2021-03-15: 1000 mg via ORAL
  Filled 2021-03-15: qty 2

## 2021-03-15 MED ORDER — FENTANYL CITRATE PF 50 MCG/ML IJ SOSY
PREFILLED_SYRINGE | INTRAMUSCULAR | Status: AC
  Administered 2021-03-15: 25 ug via INTRAVENOUS
  Filled 2021-03-15: qty 1

## 2021-03-15 MED ORDER — EPHEDRINE SULFATE-NACL 50-0.9 MG/10ML-% IV SOSY
PREFILLED_SYRINGE | INTRAVENOUS | Status: DC | PRN
  Administered 2021-03-15: 5 mg via INTRAVENOUS

## 2021-03-15 MED ORDER — EPHEDRINE 5 MG/ML INJ
INTRAVENOUS | Status: AC
  Filled 2021-03-15: qty 5

## 2021-03-15 MED ORDER — FLUTICASONE PROPIONATE 50 MCG/ACT NA SUSP
2.0000 | Freq: Every day | NASAL | Status: DC
  Administered 2021-03-16 – 2021-03-20 (×5): 2 via NASAL
  Filled 2021-03-15: qty 16

## 2021-03-15 MED ORDER — ONDANSETRON HCL 4 MG/2ML IJ SOLN
INTRAMUSCULAR | Status: AC
  Filled 2021-03-15: qty 2

## 2021-03-15 MED ORDER — OXYCODONE HCL 5 MG PO TABS
ORAL_TABLET | ORAL | Status: AC
  Administered 2021-03-15: 5 mg via ORAL
  Filled 2021-03-15: qty 1

## 2021-03-15 MED ORDER — NAPHAZOLINE-PHENIRAMINE 0.027-0.315 % OP SOLN: 1.0000 [drp] | Freq: Every day | OPHTHALMIC | Status: DC | PRN

## 2021-03-15 MED ORDER — ONDANSETRON HCL 4 MG/2ML IJ SOLN
INTRAMUSCULAR | Status: AC
  Administered 2021-03-15: 4 mg via INTRAVENOUS
  Filled 2021-03-15: qty 2

## 2021-03-15 MED ORDER — LIDOCAINE 2% (20 MG/ML) 5 ML SYRINGE
INTRAMUSCULAR | Status: DC | PRN
  Administered 2021-03-15: 1.5 mg/kg/h via INTRAVENOUS

## 2021-03-15 MED ORDER — ACETAMINOPHEN 500 MG PO TABS
1000.0000 mg | ORAL_TABLET | Freq: Four times a day (QID) | ORAL | Status: DC
  Administered 2021-03-15 – 2021-03-20 (×16): 1000 mg via ORAL
  Filled 2021-03-15 (×15): qty 2

## 2021-03-15 MED ORDER — LIDOCAINE 2% (20 MG/ML) 5 ML SYRINGE
INTRAMUSCULAR | Status: DC | PRN
  Administered 2021-03-15: 60 mg via INTRAVENOUS

## 2021-03-15 MED ORDER — PROPOFOL 10 MG/ML IV BOLUS
INTRAVENOUS | Status: AC
  Filled 2021-03-15: qty 20

## 2021-03-15 MED ORDER — 0.9 % SODIUM CHLORIDE (POUR BTL) OPTIME
TOPICAL | Status: DC | PRN
  Administered 2021-03-15: 1000 mL

## 2021-03-15 MED ORDER — LEVOTHYROXINE SODIUM 50 MCG PO TABS
50.0000 ug | ORAL_TABLET | Freq: Every day | ORAL | Status: DC
  Administered 2021-03-16 – 2021-03-20 (×5): 50 ug via ORAL
  Filled 2021-03-15 (×5): qty 1

## 2021-03-15 MED ORDER — CHLORHEXIDINE GLUCONATE CLOTH 2 % EX PADS
6.0000 | MEDICATED_PAD | Freq: Every day | CUTANEOUS | Status: DC
  Administered 2021-03-16 – 2021-03-20 (×4): 6 via TOPICAL

## 2021-03-15 MED ORDER — LACTATED RINGERS IV SOLN: INTRAVENOUS | Status: DC

## 2021-03-15 MED ORDER — KCL IN DEXTROSE-NACL 20-5-0.45 MEQ/L-%-% IV SOLN
INTRAVENOUS | Status: DC
  Filled 2021-03-15: qty 1000

## 2021-03-15 MED ORDER — POLYETHYLENE GLYCOL 3350 17 G PO PACK
17.0000 g | PACK | Freq: Every day | ORAL | Status: DC
  Administered 2021-03-16 – 2021-03-20 (×5): 17 g via ORAL
  Filled 2021-03-15 (×5): qty 1

## 2021-03-15 MED ORDER — FENTANYL CITRATE PF 50 MCG/ML IJ SOSY
25.0000 ug | PREFILLED_SYRINGE | INTRAMUSCULAR | Status: DC | PRN
  Administered 2021-03-15: 25 ug via INTRAVENOUS
  Administered 2021-03-15: 50 ug via INTRAVENOUS
  Administered 2021-03-15: 25 ug via INTRAVENOUS

## 2021-03-15 MED ORDER — DIPHENHYDRAMINE HCL 12.5 MG/5ML PO ELIX: 12.5000 mg | ORAL_SOLUTION | Freq: Four times a day (QID) | ORAL | Status: DC | PRN

## 2021-03-15 MED ORDER — LIDOCAINE 2% (20 MG/ML) 5 ML SYRINGE: INTRAMUSCULAR | Status: DC | PRN

## 2021-03-15 MED ORDER — LORATADINE 10 MG PO TABS
10.0000 mg | ORAL_TABLET | Freq: Every evening | ORAL | Status: DC
  Administered 2021-03-15 – 2021-03-19 (×5): 10 mg via ORAL
  Filled 2021-03-15 (×5): qty 1

## 2021-03-15 MED ORDER — ONDANSETRON HCL 4 MG/2ML IJ SOLN: 4.0000 mg | Freq: Four times a day (QID) | INTRAMUSCULAR | Status: DC | PRN

## 2021-03-15 MED ORDER — ROCURONIUM BROMIDE 10 MG/ML (PF) SYRINGE
PREFILLED_SYRINGE | INTRAVENOUS | Status: AC
  Filled 2021-03-15: qty 10

## 2021-03-15 MED ORDER — LACTATED RINGERS IR SOLN
Status: DC | PRN
  Administered 2021-03-15: 1000 mL

## 2021-03-15 MED ORDER — AMISULPRIDE (ANTIEMETIC) 5 MG/2ML IV SOLN: 10.0000 mg | Freq: Once | INTRAVENOUS | Status: AC | PRN

## 2021-03-15 MED ORDER — SIMETHICONE 80 MG PO CHEW: 40.0000 mg | CHEWABLE_TABLET | Freq: Four times a day (QID) | ORAL | Status: DC | PRN

## 2021-03-15 SURGICAL SUPPLY — 61 items
ADH SKN CLS APL DERMABOND .7 (GAUZE/BANDAGES/DRESSINGS) ×1
BAG COUNTER SPONGE SURGICOUNT (BAG) ×2 IMPLANT
BAG SPNG CNTER NS LX DISP (BAG) ×1
BLADE SURG 15 STRL LF DISP TIS (BLADE) ×1 IMPLANT
BLADE SURG 15 STRL SS (BLADE) ×2
COVER MAYO STAND STRL (DRAPES) ×2 IMPLANT
COVER SURGICAL LIGHT HANDLE (MISCELLANEOUS) ×2 IMPLANT
COVER TIP SHEARS 8 DVNC (MISCELLANEOUS) ×1 IMPLANT
COVER TIP SHEARS 8MM DA VINCI (MISCELLANEOUS) ×2
DECANTER SPIKE VIAL GLASS SM (MISCELLANEOUS) ×2 IMPLANT
DERMABOND ADVANCED (GAUZE/BANDAGES/DRESSINGS) ×1
DERMABOND ADVANCED .7 DNX12 (GAUZE/BANDAGES/DRESSINGS) ×2 IMPLANT
DRAIN CHANNEL 10M FLAT 3/4 FLT (DRAIN) IMPLANT
DRAPE ARM DVNC X/XI (DISPOSABLE) ×4 IMPLANT
DRAPE COLUMN DVNC XI (DISPOSABLE) ×1 IMPLANT
DRAPE DA VINCI XI ARM (DISPOSABLE) ×8
DRAPE DA VINCI XI COLUMN (DISPOSABLE) ×2
DRAPE SURG IRRIG POUCH 19X23 (DRAPES) ×2 IMPLANT
ELECT PENCIL ROCKER SW 15FT (MISCELLANEOUS) IMPLANT
ELECT REM PT RETURN 15FT ADLT (MISCELLANEOUS) ×2 IMPLANT
EVACUATOR SILICONE 100CC (DRAIN) IMPLANT
GAUZE SPONGE 4X4 12PLY STRL (GAUZE/BANDAGES/DRESSINGS) ×1 IMPLANT
GLOVE SURG ENC MOIS LTX SZ6.5 (GLOVE) ×4 IMPLANT
GLOVE SURG UNDER POLY LF SZ7 (GLOVE) ×4 IMPLANT
GOWN STRL REUS W/TWL XL LVL3 (GOWN DISPOSABLE) ×4 IMPLANT
HOLDER FOLEY CATH W/STRAP (MISCELLANEOUS) ×2 IMPLANT
IRRIG SUCT STRYKERFLOW 2 WTIP (MISCELLANEOUS) ×2
IRRIGATION SUCT STRKRFLW 2 WTP (MISCELLANEOUS) ×1 IMPLANT
KIT BASIN OR (CUSTOM PROCEDURE TRAY) ×2 IMPLANT
KIT TURNOVER KIT A (KITS) IMPLANT
LEGGING LITHOTOMY PAIR STRL (DRAPES) ×2 IMPLANT
LUBRICANT JELLY K Y 4OZ (MISCELLANEOUS) ×2 IMPLANT
NDL INSUFFLATION 14GA 120MM (NEEDLE) ×1 IMPLANT
NEEDLE HYPO 22GX1.5 SAFETY (NEEDLE) ×2 IMPLANT
NEEDLE INSUFFLATION 14GA 120MM (NEEDLE) ×2 IMPLANT
PACK CARDIOVASCULAR III (CUSTOM PROCEDURE TRAY) ×2 IMPLANT
PAD POSITIONING PINK XL (MISCELLANEOUS) ×2 IMPLANT
PROTECTOR NERVE ULNAR (MISCELLANEOUS) ×4 IMPLANT
SCISSORS LAP 5X45 EPIX DISP (ENDOMECHANICALS) ×2 IMPLANT
SEAL CANN UNIV 5-8 DVNC XI (MISCELLANEOUS) ×4 IMPLANT
SEAL XI 5MM-8MM UNIVERSAL (MISCELLANEOUS) ×8
SEALER VESSEL DA VINCI XI (MISCELLANEOUS)
SEALER VESSEL EXT DVNC XI (MISCELLANEOUS) IMPLANT
SOL ANTI FOG 6CC (MISCELLANEOUS) ×1 IMPLANT
SOLUTION ANTI FOG 6CC (MISCELLANEOUS) ×1
SOLUTION ELECTROLUBE (MISCELLANEOUS) ×2 IMPLANT
SPONGE T-LAP 18X18 ~~LOC~~+RFID (SPONGE) ×2 IMPLANT
SUT ETHIBOND 2 0 SH (SUTURE) ×2
SUT ETHIBOND 2 0 SH 36X2 (SUTURE) ×1 IMPLANT
SUT ETHILON 2 0 PS N (SUTURE) IMPLANT
SUT VIC AB 4-0 PS2 18 (SUTURE) ×2 IMPLANT
SUT VLOC 180 2-0 6IN GS21 (SUTURE) ×4 IMPLANT
SYR 10ML ECCENTRIC (SYRINGE) ×2 IMPLANT
SYR CONTROL 10ML LL (SYRINGE) ×2 IMPLANT
TOWEL OR 17X26 10 PK STRL BLUE (TOWEL DISPOSABLE) ×2 IMPLANT
TOWEL OR NON WOVEN STRL DISP B (DISPOSABLE) ×2 IMPLANT
TRAY FOLEY MTR SLVR 14FR STAT (SET/KITS/TRAYS/PACK) ×2 IMPLANT
TRAY FOLEY MTR SLVR 16FR STAT (SET/KITS/TRAYS/PACK) ×2 IMPLANT
TROCAR ADV FIXATION 5X100MM (TROCAR) ×2 IMPLANT
TUBING CONNECTING 10 (TUBING) IMPLANT
TUBING INSUFFLATION 10FT LAP (TUBING) ×2 IMPLANT

## 2021-03-15 NOTE — H&P (Signed)
PROVIDER:  Monico Blitz, MD   MRN: [id number] DOB: 12-18-1940  Subjective      History of Present Illness:   Nichole Foster is a 80 y.o. female who was seen in the office for evaluation of Rectal Prolapse.  Patient reports rectal prolapse since June 2022.  This occurs with bowel movements.  It reduces spontaneously.  Is associated with occasional rectal bleeding.  She is currently incontinent to stool.  Patient's family report a longstanding history of constipation that is currently controlled with stool softeners and MiraLAX and a high-fiber diet.  Patient recently completed a colonoscopy, which was normal except for the rectal prolapse.       Past Medical History:  Diagnosis Date   Heart murmur     High cholesterol     Hypertension     Osteoporosis     Panic attack     Stroke [facility])     Thyroid disease           Past Surgical History:  Procedure Laterality Date   CATARACT EXTRACTION        No family history on file. Social History         Socioeconomic History   Marital status: Married      Spouse name: Not on file   Number of children: Not on file   Years of education: Not on file   Highest education level: Not on file  Occupational History   Not on file  Tobacco Use   Smoking status: Never   Smokeless tobacco: Never  Vaping Use   Vaping Use: Never used  Substance and Sexual Activity   Alcohol use: No   Drug use: No   Sexual activity: Not on file  Other Topics Concern   Not on file  Social History Narrative   Not on file    Social Determinants of Health    Financial Resource Strain: Not on file  Food Insecurity: Not on file  Transportation Needs: Not on file  Physical Activity: Not on file  Stress: Not on file  Social Connections: Not on file  Intimate Partner Violence: Not on file      Current Outpatient Medications:    alendronate (FOSAMAX) 70 MG tablet, Take 70 mg by mouth every 7 (seven) days. Take with a full glass of water on an empty  stomach., Disp: , Rfl:    amLODipine (NORVASC) 10 MG tablet, Take 10 mg by mouth daily., Disp: , Rfl:    Calcium Carbonate-Vitamin D (CALCIUM 600 + D PO), Take 1 tablet by mouth daily., Disp: , Rfl:    CRANBERRY PO, Take 500 mg by mouth daily., Disp: , Rfl:    dipyridamole-aspirin (AGGRENOX) 200-25 MG per 12 hr capsule, Take 1 capsule by mouth 2 (two) times daily., Disp: , Rfl:    docusate sodium (COLACE) 100 MG capsule, Take 100 mg by mouth 2 (two) times daily., Disp: , Rfl:    ESTRACE VAGINAL 0.1 MG/GM vaginal cream, Place 2 g vaginally daily as needed (dryness)., Disp: , Rfl:    fexofenadine (ALLEGRA) 180 MG tablet, Take 90 mg by mouth daily., Disp: , Rfl:    FLUoxetine (PROZAC) 10 MG capsule, Take 1 capsule (10 mg total) by mouth daily. (Patient not taking: No sig reported), Disp: 30 capsule, Rfl: 3   fluticasone (FLONASE) 50 MCG/ACT nasal spray, Place 2 sprays into the nose daily., Disp: , Rfl:    hydrocortisone (ANUSOL-HC) 2.5 % rectal cream, Place 1 application rectally 2 (two)  times daily., Disp: 30 g, Rfl: 0   IRON PO, Take 45 mg by mouth every evening., Disp: , Rfl:    lamoTRIgine (LAMICTAL) 25 MG tablet, Take 50 mg by mouth every evening., Disp: , Rfl:    levothyroxine (SYNTHROID, LEVOTHROID) 50 MCG tablet, Take 50 mcg by mouth daily before breakfast., Disp: , Rfl:    lisinopril (PRINIVIL,ZESTRIL) 20 MG tablet, Take 1 tablet (20 mg total) by mouth daily. (Patient not taking: Reported on 10/22/2020), Disp: 30 tablet, Rfl: 0   lisinopril (ZESTRIL) 40 MG tablet, Take 40 mg by mouth every morning., Disp: , Rfl:    Multiple Vitamin (MULTIVITAMIN WITH MINERALS) TABS tablet, Take 1 tablet by mouth daily., Disp: , Rfl:    Omega-3 Fatty Acids (FISH OIL) 1200 MG CAPS, Take 1 capsule by mouth in the morning and at bedtime., Disp: , Rfl:    polyethylene glycol (MIRALAX / GLYCOLAX) packet, Take 17 g by mouth daily as needed. (Patient taking differently: Take 17 g by mouth daily.), Disp: 14 each, Rfl:  0   QUEtiapine (SEROQUEL) 100 MG tablet, Take 100 mg by mouth at bedtime., Disp: , Rfl:    risperiDONE (RISPERDAL M-TABS) 0.5 MG disintegrating tablet, Take 1 tablet (0.5 mg total) by mouth 2 (two) times daily. (Patient not taking: No sig reported), Disp: , Rfl:    senna-docusate (SENOKOT-S) 8.6-50 MG per tablet, Take 2 tablets by mouth 2 (two) times daily. (Patient not taking: No sig reported), Disp: , Rfl:    simvastatin (ZOCOR) 20 MG tablet, Take 20 mg by mouth every evening., Disp: , Rfl:          Allergies  Allergen Reactions   Sulfa Antibiotics Hives   Latex Rash      "to privates"    Review of Systems - General ROS: negative Respiratory ROS: no cough, shortness of breath, or wheezing Cardiovascular ROS: no chest pain or dyspnea on exertion Gastrointestinal ROS: no abdominal pain, change in bowel habits, or black or bloody stools Genito-Urinary ROS: no dysuria, trouble voiding, or hematuria      Objective:      Vitals:   03/15/21 1023  BP: (!) 142/50  Pulse: (!) 110  Resp: 16  Temp: 98.5 F (36.9 C)  SpO2: 100%     General appearance - alert, well appearing, and in no distress Chest - clear to auscultation, no wheezes, rales or rhonchi, symmetric air entry Heart - normal rate and regular rhythm Abdomen - soft, nontender, nondistended, no masses or organomegaly     Labs, Imaging and Diagnostic Testing:   Colonoscopy report reviewed.  Most recent CT personally reviewed.     Assessment and Plan:  Diagnoses and all orders for this visit:   Rectal prolapse   Patient with rectal prolapse.  We discussed a robotic assisted rectopexy without resection.  We discussed that her constipation could worsen after surgery.  We discussed that bladder function may be worse after surgery.  We discussed that patient's incontinence may be worse after surgery and may not improve.  We discussed the need for adequate bowel regimen after surgery to avoid constipation.  We discussed the  risk of bleeding and damage to adjacent structures.  All questions were answered.   The surgery and anatomy were described to the patient as well as the risks of surgery and the possible complications.  These include: Bleeding, deep abdominal infections and possible wound complications such as hernia and infection, damage to adjacent structures, leak of surgical connections, which  can lead to other surgeries and possibly an ostomy, possible need for other procedures, such as abscess drains in radiology, possible prolonged hospital stay, possible diarrhea from removal of part of the colon, possible constipation from narcotics, possible bowel, bladder or sexual dysfunction if having rectal surgery, prolonged fatigue/weakness or appetite loss, possible early recurrence of of disease, possible complications of their medical problems such as heart disease or arrhythmias or lung problems, death (less than 1%). I believe the patient understands and wishes to proceed with the surgery.

## 2021-03-15 NOTE — Anesthesia Postprocedure Evaluation (Signed)
Anesthesia Post Note  Patient: Nichole Foster [facility]  Procedure(s) Performed: XI ROBOT ASSISTED RECTOPEXY (Abdomen)     Patient location during evaluation: PACU Anesthesia Type: General Level of consciousness: awake and alert Pain management: pain level controlled Vital Signs Assessment: post-procedure vital signs reviewed and stable Respiratory status: spontaneous breathing, nonlabored ventilation and respiratory function stable Cardiovascular status: blood pressure returned to baseline and stable Postop Assessment: no apparent nausea or vomiting Anesthetic complications: no   No notable events documented.  Last Vitals:  Vitals:   03/15/21 1500 03/15/21 1515  BP: (!) 142/59 (!) 141/63  Pulse: (!) 53 76  Resp: 16 11  Temp:    SpO2: 98% 100%    Last Pain:  Vitals:   03/15/21 1515  TempSrc:   PainSc: [name]

## 2021-03-15 NOTE — Discharge Instructions (Signed)
SURGERY: POST OP INSTRUCTIONS (Surgery for small bowel obstruction, colon resection, etc)   ######################################################################  EAT Gradually transition to a high fiber diet with a fiber supplement over the next few days after discharge  WALK Walk an hour a day.  Control your pain to do that.    CONTROL PAIN Control pain so that you can walk, sleep, tolerate sneezing/coughing, go up/down stairs.  HAVE A BOWEL MOVEMENT DAILY Keep your bowels regular to avoid problems.  OK to try a laxative to override constipation.  OK to use an antidairrheal to slow down diarrhea.  Call if not better after 2 tries  CALL IF YOU HAVE PROBLEMS/CONCERNS Call if you are still struggling despite following these instructions. Call if you have concerns not answered by these instructions  ######################################################################   DIET Follow a light diet the first few days at home.  Start with a bland diet such as soups, liquids, starchy foods, low fat foods, etc.  If you feel full, bloated, or constipated, stay on a ful liquid or pureed/blenderized diet for a few days until you feel better and no longer constipated. Be sure to drink plenty of fluids every day to avoid getting dehydrated (feeling dizzy, not urinating, etc.). Gradually add a fiber supplement to your diet over the next week.  Gradually get back to a regular solid diet.  Avoid fast food or heavy meals the first week as you are more likely to get nauseated. It is expected for your digestive tract to need a few months to get back to normal.  It is common for your bowel movements and stools to be irregular.  You will have occasional bloating and cramping that should eventually fade away.  Until you are eating solid food normally, off all pain medications, and back to regular activities; your bowels will not be normal. Focus on eating a low-fat, high fiber diet the rest of your life  (See Getting to Good Bowel Health, below).  CARE of your INCISION or WOUND  It is good for closed incisions and even open wounds to be washed every day.  Shower every day.  Short baths are fine.  Wash the incisions and wounds clean with soap & water.    You may leave closed incisions open to air if it is dry.   You may cover the incision with clean gauze & replace it after your daily shower for comfort.  STAPLES: You have skin staples.  Leave them in place & set up an appointment for them to be removed by a surgery office nurse ~10 days after surgery. = 1st week of January 2024    ACTIVITIES as tolerated Start light daily activities --- self-care, walking, climbing stairs-- beginning the day after surgery.  Gradually increase activities as tolerated.  Control your pain to be active.  Stop when you are tired.  Ideally, walk several times a day, eventually an hour a day.   Most people are back to most day-to-day activities in a few weeks.  It takes 4-8 weeks to get back to unrestricted, intense activity. If you can walk 30 minutes without difficulty, it is safe to try more intense activity such as jogging, treadmill, bicycling, low-impact aerobics, swimming, etc. Save the most intensive and strenuous activity for last (Usually 4-8 weeks after surgery) such as sit-ups, heavy lifting, contact sports, etc.  Refrain from any intense heavy lifting or straining until you are off narcotics for pain control.  You will have off days, but things should improve   week-by-week. DO NOT PUSH THROUGH PAIN.  Let pain be your guide: If it hurts to do something, don't do it.  Pain is your body warning you to avoid that activity for another week until the pain goes down. You may drive when you are no longer taking narcotic prescription pain medication, you can comfortably wear a seatbelt, and you can safely make sudden turns/stops to protect yourself without hesitating due to pain. You may have sexual intercourse when it  is comfortable. If it hurts to do something, stop.  MEDICATIONS Take your usually prescribed home medications unless otherwise directed.   Blood thinners:  Usually you can restart any strong blood thinners after the second postoperative day.  It is OK to take aspirin right away.     If you are on strong blood thinners (warfarin/Coumadin, Plavix, Xerelto, Eliquis, Pradaxa, etc), discuss with your surgeon, medicine PCP, and/or cardiologist for instructions on when to restart the blood thinner & if blood monitoring is needed (PT/INR blood check, etc).     PAIN CONTROL Pain after surgery or related to activity is often due to strain/injury to muscle, tendon, nerves and/or incisions.  This pain is usually short-term and will improve in a few months.  To help speed the process of healing and to get back to regular activity more quickly, DO THE FOLLOWING THINGS TOGETHER: Increase activity gradually.  DO NOT PUSH THROUGH PAIN Use Ice and/or Heat Try Gentle Massage and/or Stretching Take over the counter pain medication Take Narcotic prescription pain medication for more severe pain  Good pain control = faster recovery.  It is better to take more medicine to be more active than to stay in bed all day to avoid medications.  Increase activity gradually Avoid heavy lifting at first, then increase to lifting as tolerated over the next 6 weeks. Do not "push through" the pain.  Listen to your body and avoid positions and maneuvers than reproduce the pain.  Wait a few days before trying something more intense Walking an hour a day is encouraged to help your body recover faster and more safely.  Start slowly and stop when getting sore.  If you can walk 30 minutes without stopping or pain, you can try more intense activity (running, jogging, aerobics, cycling, swimming, treadmill, sex, sports, weightlifting, etc.) Remember: If it hurts to do it, then don't do it! Use Ice and/or Heat You will have swelling and  bruising around the incisions.  This will take several weeks to resolve. Ice packs or heating pads (6-8 times a day, 30-60 minutes at a time) will help sooth soreness & bruising. Some people prefer to use ice alone, heat alone, or alternate between ice & heat.  Experiment and see what works best for you.  Consider trying ice for the first few days to help decrease swelling and bruising; then, switch to heat to help relax sore spots and speed recovery. Shower every day.  Short baths are fine.  It feels good!  Keep the incisions and wounds clean with soap & water.   Try Gentle Massage and/or Stretching Massage at the area of pain many times a day Stop if you feel pain - do not overdo it Take over the counter pain medication This helps the muscle and nerve tissues become less irritable and calm down faster Choose ONE of the following over-the-counter anti-inflammatory medications: Acetaminophen 500mg tabs (Tylenol) 1-2 pills with every meal and just before bedtime (avoid if you have liver problems or if you have   acetaminophen in you narcotic prescription) Naproxen 220mg tabs (ex. Aleve, Naprosyn) 1-2 pills twice a day (avoid if you have kidney, stomach, IBD, or bleeding problems) Ibuprofen 200mg tabs (ex. Advil, Motrin) 3-4 pills with every meal and just before bedtime (avoid if you have kidney, stomach, IBD, or bleeding problems) Take with food/snack several times a day as directed for at least 2 weeks to help keep pain / soreness down & more manageable. Take Narcotic prescription pain medication for more severe pain A prescription for strong pain control is often given to you upon discharge (for example: oxycodone/Percocet, hydrocodone/Norco/Vicodin, or tramadol/Ultram) Take your pain medication as prescribed. Be mindful that most narcotic prescriptions contain Tylenol (acetaminophen) as well - avoid taking too much Tylenol. If you are having problems/concerns with the prescription medicine (does  not control pain, nausea, vomiting, rash, itching, etc.), please call us [phone] to see if we need to switch you to a different pain medicine that will work better for you and/or control your side effects better. If you need a refill on your pain medication, you must call the office before 4 pm and on weekdays only.  By federal law, prescriptions for narcotics cannot be called into a pharmacy.  They must be filled out on paper & picked up from our office by the patient or authorized caretaker.  Prescriptions cannot be filled after 4 pm nor on weekends.    WHEN TO CALL US [phone] Severe uncontrolled or worsening pain  Fever over 101 F (38.5 C) Concerns with the incision: Worsening pain, redness, rash/hives, swelling, bleeding, or drainage Reactions / problems with new medications (itching, rash, hives, nausea, etc.) Nausea and/or vomiting Difficulty urinating Difficulty breathing Worsening fatigue, dizziness, lightheadedness, blurred vision Other concerns If you are not getting better after two weeks or are noticing you are getting worse, contact our office [phone] for further advice.  We may need to adjust your medications, re-evaluate you in the office, send you to the emergency room, or see what other things we can do to help. The clinic staff is available to answer your questions during regular business hours (8:30am-5pm).  Please don't hesitate to call and ask to speak to one of our nurses for clinical concerns.    A surgeon from [facility] is always on call at the hospitals 24 hours/day If you have a medical emergency, go to the nearest emergency room or call 911.  FOLLOW UP in our office One the day of your discharge from the hospital (or the next business weekday), please call [facility] to set up or confirm an appointment to see your surgeon in the office for a follow-up appointment.  Usually it is 2-3 weeks after your surgery.   If you  have skin staples at your incision(s), let the office know so we can set up a time in the office for the nurse to remove them (usually around 10 days after surgery). Make sure that you call for appointments the day of discharge (or the next business weekday) from the hospital to ensure a convenient appointment time. IF YOU HAVE DISABILITY OR FAMILY LEAVE FORMS, BRING THEM TO THE OFFICE FOR PROCESSING.  DO NOT GIVE THEM TO YOUR DOCTOR.  [facility] [address], [address], [place], [place]  [address] ? [phone] - Main [phone] - Toll Free,  [phone] - Fax [email or web address]    GETTING TO GOOD BOWEL HEALTH. It is expected for your digestive tract to   need a few months to get back to normal.  It is common for your bowel movements and stools to be irregular.  You will have occasional bloating and cramping that should eventually fade away.  Until you are eating solid food normally, off all pain medications, and back to regular activities; your bowels will not be normal.   Avoiding constipation The goal: ONE SOFT BOWEL MOVEMENT A DAY!    Drink plenty of fluids.  Choose water first. TAKE A FIBER SUPPLEMENT EVERY DAY THE REST OF YOUR LIFE During your first week back home, gradually add back a fiber supplement every day Experiment which form you can tolerate.   There are many forms such as powders, tablets, wafers, gummies, etc Psyllium bran (Metamucil), methylcellulose (Citrucel), Miralax or Glycolax, Benefiber, Flax Seed.  Adjust the dose week-by-week (1/2 dose/day to 6 doses a day) until you are moving your bowels 1-2 times a day.  Cut back the dose or try a different fiber product if it is giving you problems such as diarrhea or bloating. Sometimes a laxative is needed to help jump-start bowels if constipated until the fiber supplement can help regulate your bowels.  If you are tolerating eating & you are farting, it is okay to try a gentle  laxative such as double dose MiraLax, prune juice, or Milk of Magnesia.  Avoid using laxatives too often. Stool softeners can sometimes help counteract the constipating effects of narcotic pain medicines.  It can also cause diarrhea, so avoid using for too long. If you are still constipated despite taking fiber daily, eating solids, and a few doses of laxatives, call our office. Controlling diarrhea Try drinking liquids and eating bland foods for a few days to avoid stressing your intestines further. Avoid dairy products (especially milk & ice cream) for a short time.  The intestines often can lose the ability to digest lactose when stressed. Avoid foods that cause gassiness or bloating.  Typical foods include beans and other legumes, cabbage, broccoli, and dairy foods.  Avoid greasy, spicy, fast foods.  Every person has some sensitivity to other foods, so listen to your body and avoid those foods that trigger problems for you. Probiotics (such as active yogurt, Align, etc) may help repopulate the intestines and colon with normal bacteria and calm down a sensitive digestive tract Adding a fiber supplement gradually can help thicken stools by absorbing excess fluid and retrain the intestines to act more normally.  Slowly increase the dose over a few weeks.  Too much fiber too soon can backfire and cause cramping & bloating. It is okay to try and slow down diarrhea with a few doses of antidiarrheal medicines.   Bismuth subsalicylate (ex. Kayopectate, Pepto Bismol) for a few doses can help control diarrhea.  Avoid if pregnant.   Loperamide (Imodium) can slow down diarrhea.  Start with one tablet (2mg) first.  Avoid if you are having fevers or severe pain.  ILEOSTOMY PATIENTS WILL HAVE CHRONIC DIARRHEA since their colon is not in use.    Drink plenty of liquids.  You will need to drink even more glasses of water/liquid a day to avoid getting dehydrated. Record output from your ileostomy.  Expect to empty  the bag every 3-4 hours at first.  Most people with a permanent ileostomy empty their bag 4-6 times at the least.   Use antidiarrheal medicine (especially Imodium) several times a day to avoid getting dehydrated.  Start with a dose at bedtime & breakfast.  Adjust up or   down as needed.  Increase antidiarrheal medications as directed to avoid emptying the bag more than 8 times a day (every 3 hours). Work with your wound ostomy nurse to learn care for your ostomy.  See ostomy care instructions. TROUBLESHOOTING IRREGULAR BOWELS 1) Start with a soft & bland diet. No spicy, greasy, or fried foods.  2) Avoid gluten/wheat or dairy products from diet to see if symptoms improve. 3) Miralax 17gm or flax seed mixed in 8oz. water or juice-daily. May use 2-4 times a day as needed. 4) Gas-X, Phazyme, etc. as needed for gas & bloating.  5) Prilosec (omeprazole) over-the-counter as needed 6)  Consider probiotics (Align, Activa, etc) to help calm the bowels down  Call your doctor if you are getting worse or not getting better.  Sometimes further testing (cultures, endoscopy, X-ray studies, CT scans, bloodwork, etc.) may be needed to help diagnose and treat the cause of the diarrhea. [facility] [address], [address], [place], [place]  [address] [phone] - Main.    [phone]  - Toll Free.   [phone] - Fax [email or web address]   ###############################   #######################################################  Ostomy Support Information  You've heard that people get along just fine with only one of their eyes, or one of their lungs, or one of their kidneys. But you also know that you have only one intestine and only one bladder, and that leaves you feeling awfully empty, both physically and emotionally: You think no other people go around without part of their intestine with the ends of their intestines sticking out through their abdominal walls.    YOU ARE NOT ALONE.  There are nearly three quarters of a million people in the [place] who have an ostomy; people who have had surgery to remove all or part of their colons or bladders.   There is even a national association, the [employer] with over 350 local affiliated support groups that are organized by volunteers who provide peer support and counseling. [employer] has a toll free telephone num-ber, 800-826-0826 and an educational, interactive website, www.ostomy.org   An ostomy is an opening in the belly (abdominal wall) made by surgery. Ostomates are people who have had this procedure. The opening (stoma) allows the kidney or bowel to grdischarge waste. An external pouch covers the stoma to collect waste. Pouches are are a simple bag and are odor free. Different companies have disposable or reusable pouches to fit one's lifestyle. An ostomy can either be temporary or permanent.   THERE ARE THREE MAIN TYPES OF OSTOMIES Colostomy. A colostomy is a surgically created opening in the large intestine (colon). Ileostomy. An ileostomy is a surgically created opening in the small intestine. Urostomy. A urostomy is a surgically created opening to divert urine away from the bladder.  OSTOMY Care  The following guidelines will make care of your colostomy easier. Keep this information close by for quick reference.  Helpful DIET hints Eat a well-balanced diet including vegetables and fresh fruits. Eat on a regular schedule.  Drink at least 6 to 8 glasses of fluids daily. Eat slowly in a relaxed atmosphere. Chew your food thoroughly. Avoid chewing gum, smoking, and drinking from a straw. This will help decrease the amount of air you swallow, which may help reduce gas. Eating yogurt or drinking buttermilk may help reduce gas.  To control gas at night, do not eat after 8 p.m. This will give your bowel time to quiet down before you go   to bed.  If gas is a problem, you can purchase  Beano. Sprinkle Beano on the first bite of food before eating to reduce gas. It has no flavor and should not change the taste of your food. You can buy Beano over the counter at your local drugstore.  Foods like fish, onions, garlic, broccoli, asparagus, and cabbage produce odor. Although your pouch is odor-proof, if you eat these foods you may notice a stronger odor when emptying your pouch. If this is a concern, you may want to limit these foods in your diet.  If you have an ileostomy, you will have chronic diarrhea & need to drink more liquids to avoid getting dehydrated.  Consider antidiarrheal medicine like imodium (loperamide) or Lomotil to help slow down bowel movements / diarrhea into your ileostomy bag.  GETTING TO GOOD BOWEL HEALTH WITH AN ILEOSTOMY    With the colon bypassed & not in use, you will have small bowel diarrhea.   It is important to thicken & slow your bowel movements down.   The goal: 4-6 small BOWEL MOVEMENTS A DAY It is important to drink plenty of liquids to avoid getting dehydrated  CONTROLLING ILEOSTOMY DIARRHEA  TAKE A FIBER SUPPLEMENT (FiberCon or Benefiner soluble fiber) twice a day - to thicken stools by absorbing excess fluid and retrain the intestines to act more normally.  Slowly increase the dose over a few weeks.  Too much fiber too soon can backfire and cause cramping & bloating.  TAKE AN IRON SUPPLEMENT twice a day to naturally constipate your bowels.  Usually ferrous sulfate 325mg twice a day)  TAKE ANTI-DIARRHEAL MEDICINES: Loperamide (Imodium) can slow down diarrhea.  Start with two tablets (= 4mg) first and then try one tablet every 6 hours.  Can go up to 2 pills four times day (8 pills of 2mg max) Avoid if you are having fevers or severe pain.  If you are not better or start feeling worse, stop all medicines and call your doctor for advice LoMotil (Diphenoxylate / Atropine) is another medicine that can constipate & slow down bowel moevements Pepto  Bismol (bismuth) can gently thicken bowels as well  If diarrhea is worse,: drink plenty of liquids and try simpler foods for a few days to avoid stressing your intestines further. Avoid dairy products (especially milk & ice cream) for a short time.  The intestines often can lose the ability to digest lactose when stressed. Avoid foods that cause gassiness or bloating.  Typical foods include beans and other legumes, cabbage, broccoli, and dairy foods.  Every person has some sensitivity to other foods, so listen to our body and avoid those foods that trigger problems for you.Call your doctor if you are getting worse or not better.  Sometimes further testing (cultures, endoscopy, X-ray studies, bloodwork, etc) may be needed to help diagnose and treat the cause of the diarrhea. Take extra anti-diarrheal medicines (maximum is 8 pills of 2mg loperamide a day)   Tips for POUCHING an OSTOMY   Changing Your Pouch The best time to change your pouch is in the morning, before eating or drinking anything. Your stoma can function at any time, but it will function more after eating or drinking.   Applying the pouching system  Place all your equipment close at hand before removing your pouch.  Wash your hands.  Stand or sit in front of a mirror. Use the position that works best for you. Remember that you must keep the skin around the stoma   wrinkle-free for a good seal.  Gently remove the used pouch (1-piece system) or the pouch and old wafer (2-piece system). Empty the pouch into the toilet. Save the closure clip to use again.  Wash the stoma itself and the skin around the stoma. Your stoma may bleed a little when being washed. This is normal. Rinse and pat dry. You may use a wash cloth or soft paper towels (like Bounty), mild soap (like Dial, Safeguard, or Ivory), and water. Avoid soaps that contain perfumes or lotions.  For a new pouch (1-piece system) or a new wafer (2-piece system), measure your  stoma using the stoma guide in each box of supplies.  Trace the shape of your stoma onto the back of the new pouch or the back of the new wafer. Cut out the opening. Remove the paper backing and set it aside.  Optional: Apply a skin barrier powder to surrounding skin if it is irritated (bare or weeping), and dust off the excess. Optional: Apply a skin-prep wipe (such as Skin Prep or All-Kare) to the skin around the stoma, and let it dry. Do not apply this solution if the skin is irritated (red, tender, or broken) or if you have shaved around the stoma. Optional: Apply a skin barrier paste (such as Stomahesive, Coloplast, or Premium) around the opening cut in the back of the pouch or wafer. Allow it to dry for 30 to 60 seconds.  Hold the pouch (1-piece system) or wafer (2-piece system) with the sticky side toward your body. Make sure the skin around the stoma is wrinkle-free. Center the opening on the stoma, then press firmly to your abdomen (Fig. 4). Look in the mirror to check if you are placing the pouch, or wafer, in the right position. For a 2-piece system, snap the pouch onto the wafer. Make sure it snaps into place securely.  Place your hand over the stoma and the pouch or wafer for about 30 seconds. The heat from your hand can help the pouch or wafer stick to your skin.  Add deodorant (such as Super Banish or Nullo) to your pouch. Other options include food extracts such as vanilla oil and peppermint extract. Add about 10 drops of the deodorant to the pouch. Then apply the closure clamp. Note: Do not use toxic  chemicals or commercial cleaning agents in your pouch. These substances may harm the stoma.  Optional: For extra seal, apply tape to all 4 sides around the pouch or wafer, as if you were framing a picture. You may use any brand of medical adhesive tape. Change your pouch every 5 to 7 days. Change it immediately if a leak occurs.  Wash your hands afterwards.  If you are wearing a  2-piece system, you may use 2 new pouches per week and alternate them. Rinse the pouch with mild soap and warm water and hang it to dry for the next day. Apply the fresh pouch. Alternate the 2 pouches like this for a week. After a week, change the wafer and begin with 2 new pouches. Place the old pouches in a plastic bag, and put them in the trash.   LIVING WITH AN OSTOMY  Emptying Your Pouch Empty your pouch when it is one-third full (of urine, stool, and/or gas). If you wait until your pouch is fuller than this, it will be more difficult to empty and more noticeable. When you empty your pouch, either put toilet paper in the toilet bowl first, or flush the   toilet while you empty the pouch. This will reduce splashing. You can empty the pouch between your legs or to one side while sitting, or while standing or stooping. If you have a 2-piece system, you can snap off the pouch to empty it. Remember that your stoma may function during this time. If you wish to rinse your pouch after you empty it, a turkey baster can be helpful. When using a baster, squirt water up into the pouch through the opening at the bottom. With a 2-piece system, you can snap off the pouch to rinse it. After rinsing  your pouch, empty it into the toilet. When rinsing your pouch at home, put a few granules of Dreft soap in the rinse water. This helps lubricate and freshen your pouch. The inside of your pouch can be sprayed with non-stick cooking oil (Pam spray). This may help reduce stool sticking to the inside of the pouch.  Bathing You may shower or bathe with your pouch on or off. Remember that your stoma may function during this time.  The materials you use to wash your stoma and the skin around it should be clean, but they do not need to be sterile.  Wearing Your Pouch During hot weather, or if you perspire a lot in general, wear a cover over your pouch. This may prevent a rash on your skin under the pouch. Pouch covers are  sold at ostomy supply stores. Wear the pouch inside your underwear for better support. Watch your weight. Any gain or loss of 10 to 15 pounds or more can change the way your pouch fits.  Going Away From Home A collapsible cup (like those that come in travel kits) or a soft plastic squirt bottle with a pull-up top (like a travel bottle for shampoo) can be used for rinsing your pouch when you are away from home. Tilt the opening of the pouch at an upward angle when using a cup to rinse.  Carry wet wipes or extra tissues to use in public bathrooms.  Carry an extra pouching system with you at all times.  Never keep ostomy supplies in the glove compartment of your car. Extreme heat or cold can damage the skin barriers and adhesive wafers on the pouch.  When you travel, carry your ostomy supplies with you at all times. Keep them within easy reach. Do not pack ostomy supplies in baggage that will be checked or otherwise separated from you, because your baggage might be lost. If you're traveling out of the country, it is helpful to have a letter stating that you are carrying ostomy supplies as a medical necessity.  If you need ostomy supplies while traveling, look in the yellow pages of the telephone book under "Surgical Supplies." Or call the local ostomy organization to find out where supplies are available.  Do not let your ostomy supplies get low. Always order new pouches before you use the last one.  Reducing Odor Limit foods such as broccoli, cabbage, onions, fish, and garlic in your diet to help reduce odor. Each time you empty your pouch, carefully clean the opening of the pouch, both inside and outside, with toilet paper. Rinse your pouch 1 or 2 times daily after you empty it (see directions for emptying your pouch and going away from home). Add deodorant (such as Super Banish or Nullo) to your pouch. Use air deodorizers in your bathroom. Do not add aspirin to your pouch. Even though  aspirin can help prevent odor, it   could cause ulcers on your stoma.  When to call the doctor Call the doctor if you have any of the following symptoms: Purple, black, or white stoma Severe cramps lasting more than 6 hours Severe watery discharge from the stoma lasting more than 6 hours No output from the colostomy for 3 days Excessive bleeding from your stoma Swelling of your stoma to more than 1/2-inch larger than usual Pulling inward of your stoma below skin level Severe skin irritation or deep ulcers Bulging or other changes in your abdomen  When to call your ostomy nurse Call your ostomy/enterostomal therapy (WOCN) nurse if any of the following occurs: Frequent leaking of your pouching system Change in size or appearance of your stoma, causing discomfort or problems with your pouch Skin rash or rawness Weight gain or loss that causes problems with your pouch     FREQUENTLY ASKED QUESTIONS   Why haven't you met any of these folks who have an ostomy?  Well, maybe you have! You just did not recognize them because an ostomy doesn't show. It can be kept secret if you wish. Why, maybe some of your best friends, office associates or neighbors have an ostomy ... you never can tell. People facing ostomy surgery have many quality-of-life questions like: Will you bulge? Smell? Make noises? Will you feel waste leaving your body? Will you be a captive of the toilet? Will you starve? Be a social outcast? Get/stay married? Have babies? Easily bathe, go swimming, bend over?  OK, let's look at what you can expect:   Will you bulge?  Remember, without part of the intestine or bladder, and its contents, you should have a flatter tummy than before. You can expect to wear, with little exception, what you wore before surgery ... and this in-cludes tight clothing and bathing suits.   Will you smell?  Today, thanks to modern odor proof pouching systems, you can walk into an ostomy support group  meeting and not smell anything that is foul or offensive. And, for those with an ileostomy or colostomy who are concerned about odor when emptying their pouch, there are in-pouch deodorants that can be used to eliminate any waste odors that may exist.   Will you make noises?  Everyone produces gas, especially if they are an air-swallower. But intestinal sounds that occur from time to time are no differ-ent than a gurgling tummy, and quite often your clothing will muffle any sounds.   Will you feel the waste discharges?  For those with a colostomy or ileostomy there might be a slight pressure when waste leaves your body, but understand that the intestines have no nerve endings, so there will be no unpleasant sensations. Those with a urostomy will probably be unaware of any kidney drainage.   Will you be a captive of the toilet?  Immediately post-op you will spend more time in the bathroom than you will after your body recovers from surgery. Every person is different, but on average those with an ileostomy or urostomy may empty their pouches 4 to 6 times a day; a little  less if you have a colostomy. The average wear time between pouch system changes is 3 to 5 days and the changing process should take less than 30 minutes.   Will I need to be on a special diet? Most people return to their normal diet when they have recovered from surgery. Be sure to chew your food well, eat a well-balanced diet and drink plenty of fluids. If   you experience problems with a certain food, wait a couple of weeks and try it again.  Will there be odor and noises? Pouching systems are designed to be odor-proof or odor-resistant. There are deodorants that can be used in the pouch. Medications are also available to help reduce odor. Limit gas-producing foods and carbonated beverages. You will experience less gas and fewer noises as you heal from surgery.  How much time will it take to care for my ostomy? At first, you may  spend a lot of time learning about your ostomy and how to take care of it. As you become more comfortable and skilled at changing the pouching system, it will take very little time to care for it.   Will I be able to return to work? People with ostomies can perform most jobs. As soon as you have healed from surgery, you should be able to return to work. Heavy lifting (more than 10 pounds) may be discouraged.   What about intimacy? Sexual relationships and intimacy are important and fulfilling aspects of your life. They should continue after ostomy surgery. Intimacy-related concerns should be discussed openly between you and your partner.   Can I wear regular clothing? You do not need to wear special clothing. Ostomy pouches are fairly flat and barely noticeable. Elastic undergarments will not hurt the stoma or prevent the ostomy from functioning.   Can I participate in sports? An ostomy should not limit your involvement in sports. Many people with ostomies are runners, skiers, swimmers or participate in other active lifestyles. Talk with your caregiver first before doing heavy physical activity.  Will you starve?  Not if you follow doctor's orders at each stage of your post-op adjustment. There is no such thing as an "ostomy diet". Some people with an ostomy will be able to eat and tolerate anything; others may find diffi-culty with some foods. Each person is an individual and must determine, by trial, what is best for them. A good practice for all is to drink plenty of water.   Will you be a social outcast?  Have you met anyone who has an ostomy and is a social outcast? Why should you be the first? Only your attitude and self image will effect how you are treated. No confi-dent person is an outcast.    PROFESSIONAL HELP   Resources are available if you need help or have questions about your ostomy.   Specially trained nurses called Wound, Ostomy Continence Nurses (WOCN) are available for  consultation in most major medical centers.  Consider getting an ostomy consult at an outpatient ostomy clinic.   [place] has an Ostomy Clinic run by an WOCN ostomy nurse at the [facility].  [phone]. [facility] can help set up an appointment   The [employer] Ostomy Association (UOA) is a group made up of many local chapters throughout the [place]. These local groups hold meetings and provide support to prospective and existing ostomates. They sponsor educational events and have qualified visitors to make personal or telephone visits. Contact the UOA for the chapter nearest you and for other educational publications.  More detailed information can be found in Colostomy Guide, a publication of the United Ostomy Association (UOA). Contact UOA at 1-800-826-0826 or visit their web site at www.uoaa.org. The website contains links to other sites, suppliers and resources.  [facility] Secure Start Services: Start at the website to enlist for support.  Your Wound Ostomy (WOCN) nurse may have started this   process. [email or web address]://[email or web address].com/en/securestart Secure Start services are designed to support people as they live their lives with an ostomy or neurogenic bladder. Enrolling is easy and at no cost to the patient. We realize that each person's needs and life journey are different. Through Secure Start services, we want to help people live their life, their way.  #######################################################  

## 2021-03-15 NOTE — Transfer of Care (Signed)
Immediate Anesthesia Transfer of Care Note  Patient: Nichole Foster [facility]  Procedure(s) Performed: XI ROBOT ASSISTED RECTOPEXY (Abdomen)  Patient Location: PACU  Anesthesia Type:General  Level of Consciousness: awake, alert  and oriented  Airway & Oxygen Therapy: Patient Spontanous Breathing and Patient connected to face mask oxygen  Post-op Assessment: Report given to RN and Post -op Vital signs reviewed and stable  Post vital signs: Reviewed and stable  Last Vitals:  Vitals Value Taken Time  BP    Temp    Pulse    Resp    SpO2      Last Pain:  Vitals:   03/15/21 1023  TempSrc: Oral  PainSc:          Complications: No notable events documented.

## 2021-03-15 NOTE — Anesthesia Procedure Notes (Signed)
Procedure Name: Intubation Date/Time: 03/15/2021 11:41 AM Performed by: Maxwell Caul, CRNA Pre-anesthesia Checklist: Patient identified, Emergency Drugs available, Suction available and Patient being monitored Patient Re-evaluated:Patient Re-evaluated prior to induction Oxygen Delivery Method: Circle system utilized Preoxygenation: Pre-oxygenation with 100% oxygen Induction Type: IV induction Ventilation: Mask ventilation without difficulty Laryngoscope Size: Mac and 4 Grade View: Grade I Tube type: Oral Tube size: 7.0 mm Number of attempts: 1 Airway Equipment and Method: Stylet Placement Confirmation: ETT inserted through vocal cords under direct vision, positive ETCO2 and breath sounds checked- equal and bilateral Secured at: 21 cm Tube secured with: Tape Dental Injury: Teeth and Oropharynx as per pre-operative assessment

## 2021-03-15 NOTE — Progress Notes (Signed)
Pharmacy Brief Note - Alvimopan (Entereg)   Per policy, this patient cannot receive post op Entereg as no order was placed for preop dose and therefore preop dose was not given.   Adrian Saran, PharmD, BCPS Secure Chat if ?s 03/15/2021 6:18 PM

## 2021-03-15 NOTE — Anesthesia Preprocedure Evaluation (Signed)
Anesthesia Evaluation  Patient identified by MRN, date of birth, ID band Patient awake    Reviewed: Allergy & Precautions, NPO status , Patient's Chart, lab work & pertinent test results  History of Anesthesia Complications Negative for: history of anesthetic complications  Airway Mallampati: II  TM Distance: >3 FB Neck ROM: Full    Dental  (+) Dental Advisory Given, Teeth Intact, Missing   Pulmonary neg pulmonary ROS,    Pulmonary exam normal        Cardiovascular hypertension, Pt. on medications Normal cardiovascular exam   Echo 2010: EF 60-65%, no RWMA, g1dd, nl RV fn, valves normal   Neuro/Psych Anxiety Depression CVA, No Residual Symptoms    GI/Hepatic negative GI ROS, Neg liver ROS,   Endo/Other  Hypothyroidism   Renal/GU negative Renal ROS  negative genitourinary   Musculoskeletal negative musculoskeletal ROS (+)   Abdominal   Peds  Hematology  (+) Blood dyscrasia (Hgb 11.8), anemia ,   Anesthesia Other Findings   Reproductive/Obstetrics                           Anesthesia Physical Anesthesia Plan  ASA: 3  Anesthesia Plan: General   Post-op Pain Management:    Induction: Intravenous  PONV Risk Score and Plan: 4 or greater and Ondansetron, Dexamethasone, Treatment may vary due to age or medical condition and Midazolam  Airway Management Planned: Oral ETT  Additional Equipment: None  Intra-op Plan:   Post-operative Plan: Extubation in OR  Informed Consent: I have reviewed the patients History and Physical, chart, labs and discussed the procedure including the risks, benefits and alternatives for the proposed anesthesia with the patient or authorized representative who has indicated his/her understanding and acceptance.     Dental advisory given  Plan Discussed with:   Anesthesia Plan Comments:         Anesthesia Quick Evaluation

## 2021-03-15 NOTE — Op Note (Signed)
03/15/2021  1:43 PM  PATIENT:  Nichole Foster  80 y.o. female  Patient Care Team: Donald Prose, MD as PCP - General (Family Medicine)  PRE-OPERATIVE DIAGNOSIS:  RECTAL PROLAPSE  POST-OPERATIVE DIAGNOSIS:  RECTAL PROLAPSE  PROCEDURE:  XI ROBOT ASSISTED RECTOPEXY  Surgeon(s): Leighton Ruff, MD Ileana Roup, MD  ASSISTANT: Dr Dema Severin   ANESTHESIA:   local and general  EBL:46ml  Total I/O In: 1100 [I.V.:1000; IV Piggyback:100] Out: 350 [Urine:300; Blood:50]  Delay start of Pharmacological VTE agent (>24hrs) due to surgical blood loss or risk of bleeding:  no  DRAINS: (34F) Jackson-Pratt drain(s) with closed bulb suction in the pelvis    SPECIMEN:  Source of Specimen:  none  DISPOSITION OF SPECIMEN:  PATHOLOGY  COUNTS:  YES  PLAN OF CARE: Admit to inpatient   PATIENT DISPOSITION:  PACU - hemodynamically stable.  INDICATION:    80 y.o. F with rectal prolapse.  I recommended rectopexy:  The anatomy & physiology of the digestive tract was discussed.  The pathophysiology was discussed.  Natural history risks without surgery was discussed.   I worked to give an overview of the disease and the frequent need to have multispecialty involvement.  I feel the risks of no intervention will lead to serious problems that outweigh the operative risks; therefore, I recommended a partial colectomy to remove the pathology.  Laparoscopic & open techniques were discussed.   Risks such as bleeding, infection, abscess, leak, reoperation, possible ostomy, hernia, heart attack, death, and other risks were discussed.  I noted a good likelihood this will help address the problem.   Goals of post-operative recovery were discussed as well.    The patient expressed understanding & wished to proceed with surgery.  OR FINDINGS:   Patient had significantly redundant sigmoid colon  DESCRIPTION:   Informed consent was confirmed.  The patient underwent general anaesthesia without difficulty.   The patient was positioned appropriately.  VTE prevention in place.  The patient's abdomen was clipped, prepped, & draped in a sterile fashion.  Surgical timeout confirmed our plan.  The patient was positioned in reverse Trendelenburg.  Abdominal entry was gained using a Varies needle in the LUQ.  Entry was clean.  I induced carbon dioxide insufflation.  An 20mm robotic port was placed in the RUQ.  Camera inspection revealed no injury.  Extra ports were carefully placed under direct laparoscopic visualization.  I laparoscopically reflected the greater omentum and the upper abdomen the small bowel in the upper abdomen. The patient was appropriately positioned and the robot was docked to the patient's left side.  Instruments were placed under direct visualization.     I scored the base of peritoneum of the right side of the mesentery of the left colon from the ligament of Treitz to the peritoneal reflection of the mid rectum.   I elevated the sigmoid mesentery and enetered into the retro-mesenteric plane. We were able to identify the left ureter and gonadal vessels. We kept those posterior within the retroperitoneum and elevated the left colon mesentery off that. I continued distally and got into the avascular plane posterior to the mesorectum.  I continued down using blunt dissection and electrocautery towards the pelvic floor.  I mobilized the peritoneal coverings towards the peritoneal reflection on both the right and left sides of the rectum.  I could see the right and left ureters and stayed away from them.  I continued my dissection laterally on the right side and divided the peritoneal reflection using  electrocautery.  I mobilized the right lateral mesorectum completely.  I left the anterior attachments intact.  I was able to mobilize the rectum out of the pelvis.  The peritoneal reflection easily reached the sacral promontory.  I pulled this towards the sacral promontory and confirmed that all prolapse was  reduced with digital rectal exam.  Once this was complete the peritoneal coverings were attached to the sacral promontory using a 2 OV lock suture to hold the tissues in place.  I then placed 3 interrupted #0 Ethibond sutures through the sacral promontory periosteum and attach this to the peritoneal reflection tissue.  This allowed for fixation of the rectum in the pelvis.  I inspected her pelvis.  Hemostasis was good.  A 19 [place] Blake drain was placed into the pelvis and secured to the patient's right side through the right lower quadrant port site.  Once this was complete the peritoneal reflection was closed using 2, 2 oh V-Loc sutures in a running fashion.  This allowed the sigmoid colon to sit in the pelvis without significant angulation.  I inspected the colon.  There is no sign of injury.  I inspected the surrounding small bowel and there was also no signs of significant injury.  The abdomen was irrigated with saline.  The robot was undocked and the ports were removed.  The port sites were closed using interrupted 4-0 Vicryl sutures and Dermabond.  The patient was awakened from anesthesia and sent to the postanesthesia care unit in stable condition.  All counts were correct per operating room staff.  An MD assistant was necessary for tissue manipulation, retraction and positioning due to the complexity of the case and hospital policies

## 2021-03-16 ENCOUNTER — Encounter (HOSPITAL_COMMUNITY): Payer: Self-pay | Admitting: General Surgery

## 2021-03-16 ENCOUNTER — Other Ambulatory Visit: Payer: Self-pay

## 2021-03-16 LAB — CBC
HCT: 30.3 % — ABNORMAL LOW (ref 36.0–46.0)
Hemoglobin: 10.2 g/dL — ABNORMAL LOW (ref 12.0–15.0)
MCH: 30.5 pg (ref 26.0–34.0)
MCHC: 33.7 g/dL (ref 30.0–36.0)
MCV: 90.7 fL (ref 80.0–100.0)
Platelets: 285 10*3/uL (ref 150–400)
RBC: 3.34 MIL/uL — ABNORMAL LOW (ref 3.87–5.11)
RDW: 14.9 % (ref 11.5–15.5)
WBC: 8.9 10*3/uL (ref 4.0–10.5)
nRBC: 0 % (ref 0.0–0.2)

## 2021-03-16 LAB — BASIC METABOLIC PANEL
Anion gap: 7 (ref 5–15)
BUN: 11 mg/dL (ref 8–23)
CO2: 25 mmol/L (ref 22–32)
Calcium: 8.1 mg/dL — ABNORMAL LOW (ref 8.9–10.3)
Chloride: 96 mmol/L — ABNORMAL LOW (ref 98–111)
Creatinine, Ser: 0.75 mg/dL (ref 0.44–1.00)
GFR, Estimated: >60 mL/min (ref >60)
Glucose, Bld: 130 mg/dL — ABNORMAL HIGH (ref 70–99)
Potassium: 4.4 mmol/L (ref 3.5–5.1)
Sodium: 128 mmol/L — ABNORMAL LOW (ref 135–145)

## 2021-03-16 NOTE — Progress Notes (Signed)
Pt very distended this afternoon. She is walking and trying to pass flatus but no luck yet, only burping. MD notified, will back down to clear liquid diet for now.

## 2021-03-16 NOTE — Progress Notes (Signed)
1 Day Post-Op   Subjective/Chief Complaint: Nausea postop yesterday, bloated, did have some flatus, not up much yet   Objective: Vital signs in last 24 hours: Temp:  [97.6 F (36.4 C)-98.5 F (36.9 C)] 98.3 F (36.8 C) (11/19 0542) Pulse Rate:  [48-110] 58 (11/19 0542) Resp:  [11-20] 16 (11/19 0542) BP: (119-150)/(46-73) 133/46 (11/19 0542) SpO2:  [94 %-100 %] 95 % (11/19 0542) Weight:  [55.3 kg-58.3 kg] 58.3 kg (11/19 0700) Last BM Date: 03/14/21 (reported by husband)  Intake/Output from previous day: 11/18 0701 - 11/19 0700 In: 2909.6 [P.O.:240; I.V.:2569.6; IV Piggyback:100] Out: 2450 [Urine:2230; Drains:170; Blood:50] Intake/Output this shift: No intake/output data recorded.  General nad Cv regular Pulm effort normal Ab jp serosang, mild distended mild general tenderness, incisions clean   Lab Results:  Recent Labs    03/16/21 0410  WBC 8.9  HGB 10.2*  HCT 30.3*  PLT 285   BMET Recent Labs    03/16/21 0410  NA 128*  K 4.4  CL 96*  CO2 25  GLUCOSE 130*  BUN 11  CREATININE 0.75  CALCIUM 8.1*   PT/INR No results for input(s): LABPROT, INR in the last 72 hours. ABG No results for input(s): PHART, HCO3 in the last 72 hours.  Invalid input(s): PCO2, PO2  Studies/Results: No results found.  Anti-infectives: Anti-infectives (From admission, onward)    Start     Dose/Rate Route Frequency Ordered Stop   03/15/21 0945  cefoTEtan (CEFOTAN) 2 g in sodium chloride 0.9 % 100 mL IVPB        2 g 200 mL/hr over 30 Minutes Intravenous On call to O.R. 03/15/21 0940 03/15/21 1215       Assessment/Plan: POD 1 rectopexy -leave on regular diet but if any more issues may need to be backed down -appears Foley should come out tomorrow per Dr Marcello Moores -oob, pulm toilet -na 128 postop, will recheck in am -lovenox, scds  Rolm Bookbinder 03/16/2021

## 2021-03-17 ENCOUNTER — Inpatient Hospital Stay (HOSPITAL_COMMUNITY): Payer: PPO

## 2021-03-17 LAB — CBC
HCT: 31.9 % — ABNORMAL LOW (ref 36.0–46.0)
Hemoglobin: 10.3 g/dL — ABNORMAL LOW (ref 12.0–15.0)
MCH: 29.9 pg (ref 26.0–34.0)
MCHC: 32.3 g/dL (ref 30.0–36.0)
MCV: 92.7 fL (ref 80.0–100.0)
Platelets: 287 10*3/uL (ref 150–400)
RBC: 3.44 MIL/uL — ABNORMAL LOW (ref 3.87–5.11)
RDW: 15.2 % (ref 11.5–15.5)
WBC: 7.2 10*3/uL (ref 4.0–10.5)
nRBC: 0 % (ref 0.0–0.2)

## 2021-03-17 LAB — BASIC METABOLIC PANEL
Anion gap: 5 (ref 5–15)
BUN: 14 mg/dL (ref 8–23)
CO2: 28 mmol/L (ref 22–32)
Calcium: 8.5 mg/dL — ABNORMAL LOW (ref 8.9–10.3)
Chloride: 98 mmol/L (ref 98–111)
Creatinine, Ser: 0.94 mg/dL (ref 0.44–1.00)
GFR, Estimated: >60 mL/min (ref >60)
Glucose, Bld: 81 mg/dL (ref 70–99)
Potassium: 4.4 mmol/L (ref 3.5–5.1)
Sodium: 131 mmol/L — ABNORMAL LOW (ref 135–145)

## 2021-03-17 MED ORDER — KCL IN DEXTROSE-NACL 20-5-0.9 MEQ/L-%-% IV SOLN
INTRAVENOUS | Status: DC
  Filled 2021-03-17 (×3): qty 1000

## 2021-03-17 NOTE — Progress Notes (Signed)
2 Days Post-Op   Subjective/Chief Complaint: Passed flatus once, yesterday with burping and distended, was in chair, feels ok this am, no n/v   Objective: Vital signs in last 24 hours: Temp:  [97.5 F (36.4 C)-98.6 F (37 C)] 98.5 F (36.9 C) (11/20 0607) Pulse Rate:  [42-73] 42 (11/20 0607) Resp:  [14-16] 14 (11/20 0607) BP: (128-144)/(46-56) 128/56 (11/20 0607) SpO2:  [95 %-98 %] 97 % (11/20 0607) Weight:  [56.3 kg] 56.3 kg (11/20 0500) Last BM Date: 03/14/21 (reported by husband)  Intake/Output from previous day: 11/19 0701 - 11/20 0700 In: 1020 [P.O.:1020] Out: 2005 [Urine:1850; Drains:155] Intake/Output this shift: Total I/O In: 120 [P.O.:120] Out: -   General nad Pulm effort normal Cv regular Ab mild distended, approp tender few bs, drain serosang, incsiions clean  Lab Results:  Recent Labs    03/16/21 0410 03/17/21 0413  WBC 8.9 7.2  HGB 10.2* 10.3*  HCT 30.3* 31.9*  PLT 285 287   BMET Recent Labs    03/16/21 0410 03/17/21 0413  NA 128* 131*  K 4.4 4.4  CL 96* 98  CO2 25 28  GLUCOSE 130* 81  BUN 11 14  CREATININE 0.75 0.94  CALCIUM 8.1* 8.5*   PT/INR No results for input(s): LABPROT, INR in the last 72 hours. ABG No results for input(s): PHART, HCO3 in the last 72 hours.  Invalid input(s): PCO2, PO2  Studies/Results: No results found.  Anti-infectives: Anti-infectives (From admission, onward)    Start     Dose/Rate Route Frequency Ordered Stop   03/15/21 0945  cefoTEtan (CEFOTAN) 2 g in sodium chloride 0.9 % 100 mL IVPB        2 g 200 mL/hr over 30 Minutes Intravenous On call to O.R. 03/15/21 0940 03/15/21 1215       Assessment/Plan: POD 2 rectopexy -clears, appears to have ileus, will restart some iv fluids as not taking much in, will check xray as baseline but hopefully will just resolve quickly -foley out, needs to void -oob, pulm toilet -na 131 today, improving -lovenox, scds -discussed plan with patient and  daughter  Rolm Bookbinder 03/17/2021

## 2021-03-18 LAB — CBC
HCT: 35.5 % — ABNORMAL LOW (ref 36.0–46.0)
Hemoglobin: 11.6 g/dL — ABNORMAL LOW (ref 12.0–15.0)
MCH: 30.6 pg (ref 26.0–34.0)
MCHC: 32.7 g/dL (ref 30.0–36.0)
MCV: 93.7 fL (ref 80.0–100.0)
Platelets: 296 10*3/uL (ref 150–400)
RBC: 3.79 MIL/uL — ABNORMAL LOW (ref 3.87–5.11)
RDW: 15.1 % (ref 11.5–15.5)
WBC: 8.2 10*3/uL (ref 4.0–10.5)
nRBC: 0 % (ref 0.0–0.2)

## 2021-03-18 LAB — BASIC METABOLIC PANEL
Anion gap: 6 (ref 5–15)
BUN: 12 mg/dL (ref 8–23)
CO2: 25 mmol/L (ref 22–32)
Calcium: 8.6 mg/dL — ABNORMAL LOW (ref 8.9–10.3)
Chloride: 101 mmol/L (ref 98–111)
Creatinine, Ser: 0.68 mg/dL (ref 0.44–1.00)
GFR, Estimated: >60 mL/min (ref >60)
Glucose, Bld: 119 mg/dL — ABNORMAL HIGH (ref 70–99)
Potassium: 4.4 mmol/L (ref 3.5–5.1)
Sodium: 132 mmol/L — ABNORMAL LOW (ref 135–145)

## 2021-03-18 MED ORDER — MAGNESIUM OXIDE -MG SUPPLEMENT 400 (240 MG) MG PO TABS
400.0000 mg | ORAL_TABLET | Freq: Every day | ORAL | Status: DC
  Administered 2021-03-18 – 2021-03-20 (×3): 400 mg via ORAL
  Filled 2021-03-18 (×3): qty 1

## 2021-03-18 NOTE — Progress Notes (Signed)
3 Days Post-Op robotic rectopexy  Subjective/Chief Complaint: No real bowel function yet, min flatus, was in chair, feels ok this am, no n/v   Objective: Vital signs in last 24 hours: Temp:  [97.5 F (36.4 C)-99 F (37.2 C)] 97.5 F (36.4 C) (11/21 0501) Pulse Rate:  [39-64] 64 (11/21 0501) Resp:  [16-17] 16 (11/21 0501) BP: (135-155)/(55-78) 138/60 (11/21 0501) SpO2:  [96 %-98 %] 98 % (11/21 0501) Weight:  [55.8 kg] 55.8 kg (11/21 0501) Last BM Date: 03/17/21  Intake/Output from previous day: 11/20 0701 - 11/21 0700 In: 3161.6 [P.O.:2160; I.V.:1001.6] Out: 1675 [Urine:1400; Drains:275] Intake/Output this shift: No intake/output data recorded.  General nad Pulm effort normal Cv regular Ab mild distended, approp tender few bs, drain serosang, incsiions clean  Lab Results:  Recent Labs    03/17/21 0413 03/18/21 0422  WBC 7.2 8.2  HGB 10.3* 11.6*  HCT 31.9* 35.5*  PLT 287 296    BMET Recent Labs    03/17/21 0413 03/18/21 0422  NA 131* 132*  K 4.4 4.4  CL 98 101  CO2 28 25  GLUCOSE 81 119*  BUN 14 12  CREATININE 0.94 0.68  CALCIUM 8.5* 8.6*    PT/INR No results for input(s): LABPROT, INR in the last 72 hours. ABG No results for input(s): PHART, HCO3 in the last 72 hours.  Invalid input(s): PCO2, PO2  Studies/Results: DG Abd Portable 1V  Result Date: 03/17/2021 CLINICAL DATA:  Ileus EXAM: PORTABLE ABDOMEN - 1 VIEW COMPARISON:  October 04, 2005 FINDINGS: Some sort a catheter overlies the right and central pelvis. The bowel gas pattern is nonobstructive. No convincing ileus. No other abnormalities. IMPRESSION: 1. Some sort a catheter overlies the pelvis. 2. No evidence of ileus or obstruction. Electronically Signed   By: Dorise Bullion III M.D.   On: 03/17/2021 12:06    Anti-infectives: Anti-infectives (From admission, onward)    Start     Dose/Rate Route Frequency Ordered Stop   03/15/21 0945  cefoTEtan (CEFOTAN) 2 g in sodium chloride 0.9 % 100 mL  IVPB        2 g 200 mL/hr over 30 Minutes Intravenous On call to O.R. 03/15/21 0940 03/15/21 1215       Assessment/Plan: POD 3 rectopexy -clears, appears to have colonic ileus, cont iv fluids as not taking much in -foley out, voiding well -oob, pulm toilet -na 132 today, improving -lovenox, scds   Rosario Adie 20/80/2233

## 2021-03-19 MED ORDER — TRAMADOL HCL 50 MG PO TABS: 50.0000 mg | ORAL_TABLET | Freq: Four times a day (QID) | ORAL | 0 refills | Status: AC | PRN

## 2021-03-19 MED ORDER — MAGNESIUM OXIDE -MG SUPPLEMENT 400 (240 MG) MG PO TABS: 400.0000 mg | ORAL_TABLET | Freq: Every day | ORAL | 0 refills | Status: AC

## 2021-03-19 NOTE — Discharge Summary (Addendum)
Physician Discharge Summary  Patient ID: Nichole Foster MRN: [id number] DOB/AGE: 1940-09-29 80 y.o.  Admit date: 03/15/2021 Discharge date: 03/19/2021  Admission Diagnoses:  Discharge Diagnoses:  Principal Problem:   Rectal prolapse   Discharged Condition: good  Hospital Course: Patient was admitted to the med surg floor after surgery.  Diet was advanced as tolerated.  Patient began to have bowel function on postop day 2.  By postop day 5, She was tolerating a solid diet and pain was controlled with oral medications.  She was urinating without difficulty and ambulating without assistance.  Patient was felt to be in stable condition for discharge to home.   Consults: None  Significant Diagnostic Studies: labs: cbc, bmet  Treatments: IV hydration, analgesia: acetaminophen, and surgery: robotic rectopexy  Discharge Exam: Blood pressure (!) 133/49, pulse 62, temperature 98.3 F (36.8 C), temperature source Oral, resp. rate 15, height 5\' 2"  (1.575 m), weight 55.8 kg, SpO2 97 %. General appearance: alert and cooperative GI: soft, non-tender; bowel sounds normal; no masses,  no organomegaly Incision/Wound: clean, dry, intact  Disposition: home   Allergies as of 03/19/2021       Reactions   Sulfa Antibiotics Hives   Latex Rash   "to privates"        Medication List     TAKE these medications    alendronate 70 MG tablet Commonly known as: FOSAMAX Take 70 mg by mouth every Friday. Take with a full glass of water on an empty stomach.   amLODipine 10 MG tablet Commonly known as: NORVASC Take 10 mg by mouth daily.   Calcium Carb-Cholecalciferol 600-20 MG-MCG Chew Chew 1 tablet by mouth daily.   CRANBERRY PO Take 500 mg by mouth daily.   dipyridamole-aspirin 200-25 MG 12hr capsule Commonly known as: AGGRENOX Take 1 capsule by mouth 2 (two) times daily.   docusate sodium 100 MG capsule Commonly known as: COLACE Take 100 mg by mouth 2 (two) times daily.    ESTRACE VAGINAL 0.1 MG/GM vaginal cream Generic drug: estradiol Place 2 g vaginally daily as needed (dryness).   Fish Oil 500 MG Caps Take 500 mg by mouth daily.   fluticasone 50 MCG/ACT nasal spray Commonly known as: FLONASE Place 2 sprays into the nose daily.   hydrocortisone 2.5 % rectal cream Commonly known as: ANUSOL-HC Place 1 application rectally 2 (two) times daily. What changed:  when to take this reasons to take this   IRON PO Take 45 mg by mouth every evening.   lamoTRIgine 25 MG tablet Commonly known as: LAMICTAL Take 50 mg by mouth daily before breakfast.   levocetirizine 5 MG tablet Commonly known as: XYZAL Take 5 mg by mouth every evening.   levothyroxine 50 MCG tablet Commonly known as: SYNTHROID Take 50 mcg by mouth daily before breakfast.   lisinopril 20 MG tablet Commonly known as: ZESTRIL Take 1 tablet (20 mg total) by mouth daily. What changed:  how much to take when to take this   magnesium oxide 400 (240 Mg) MG tablet Commonly known as: MAG-OX Take 1 tablet (400 mg total) by mouth daily.   multivitamin with minerals Tabs tablet Take 1 tablet by mouth daily. 50 plus   Opcon-A 0.027-0.315 % Soln Generic drug: Naphazoline-Pheniramine Place 1 drop into both eyes daily as needed (Dry eyes).   polyethylene glycol 17 g packet Commonly known as: MIRALAX / GLYCOLAX Take 17 g by mouth daily as needed. What changed: when to take this   QUEtiapine 100 MG  tablet Commonly known as: SEROQUEL Take 100 mg by mouth at bedtime.   simvastatin 20 MG tablet Commonly known as: ZOCOR Take 20 mg by mouth every evening.   traMADol 50 MG tablet Commonly known as: ULTRAM Take 1 tablet (50 mg total) by mouth every 6 (six) hours as needed for moderate pain.        Follow-up Information     Leighton Ruff, MD Follow up.   Specialties: General Surgery, Colon and Rectal Surgery Contact information: [facility] [place]  [phone]                 Signed: Rosario Adie 07/62/2633, 7:41 AM

## 2021-03-19 NOTE — Care Management Important Message (Signed)
Important Message  Patient Details IM Letter given to the Patient. Name: Nichole Foster MRN: [id number] Date of Birth: 17-Oct-1940   Medicare Important Message Given:  Yes     Kerin Salen 03/19/2021, 11:55 AM

## 2021-03-19 NOTE — Progress Notes (Signed)
4 Days Post-Op robotic rectopexy  Subjective/Chief Complaint: Having bowel function and passing flatus, feels ok this am.  Eating well per RN staff   Objective: Vital signs in last 24 hours: Temp:  [97.3 F (36.3 C)-98.3 F (36.8 C)] 98.3 F (36.8 C) (11/22 0524) Pulse Rate:  [62-108] 62 (11/22 0524) Resp:  [14-16] 15 (11/22 0524) BP: (133-151)/(48-57) 133/49 (11/22 0524) SpO2:  [97 %-100 %] 97 % (11/22 0524) Weight:  [55.8 kg] 55.8 kg (11/22 0500) Last BM Date: 03/18/21  Intake/Output from previous day: 11/21 0701 - 11/22 0700 In: 2632.7 [P.O.:1440; I.V.:1192.7] Out: 135 [Drains:135] Intake/Output this shift: Total I/O In: 300 [P.O.:300] Out: 0   General nad Pulm effort normal Cv regular Ab mild distended, approp tender  Lab Results:  Recent Labs    03/17/21 0413 03/18/21 0422  WBC 7.2 8.2  HGB 10.3* 11.6*  HCT 31.9* 35.5*  PLT 287 296    BMET Recent Labs    03/17/21 0413 03/18/21 0422  NA 131* 132*  K 4.4 4.4  CL 98 101  CO2 28 25  GLUCOSE 81 119*  BUN 14 12  CREATININE 0.94 0.68  CALCIUM 8.5* 8.6*    PT/INR No results for input(s): LABPROT, INR in the last 72 hours. ABG No results for input(s): PHART, HCO3 in the last 72 hours.  Invalid input(s): PCO2, PO2  Studies/Results: No results found.  Anti-infectives: Anti-infectives (From admission, onward)    Start     Dose/Rate Route Frequency Ordered Stop   03/15/21 0945  cefoTEtan (CEFOTAN) 2 g in sodium chloride 0.9 % 100 mL IVPB        2 g 200 mL/hr over 30 Minutes Intravenous On call to O.R. 03/15/21 0940 03/15/21 1215       Assessment/Plan: POD 4 rectopexy -soft diet SL IVf -oob, pulm toilet D/c JP Plan for d/c within the next 24h -lovenox, scds   Rosario Adie 03/70/4888

## 2021-03-20 NOTE — Progress Notes (Signed)
Patient was given discharge instructions by Cristy on 11/22, and all questions were answered. Patient was stable for discharge on 11/23 and was taken to the main exit by wheelchair.

## 2021-03-28 DIAGNOSIS — F411 Generalized anxiety disorder: Secondary | ICD-10-CM | POA: Diagnosis not present

## 2021-03-28 DIAGNOSIS — F4310 Post-traumatic stress disorder, unspecified: Secondary | ICD-10-CM | POA: Diagnosis not present

## 2021-03-28 DIAGNOSIS — F315 Bipolar disorder, current episode depressed, severe, with psychotic features: Secondary | ICD-10-CM | POA: Diagnosis not present

## 2021-04-05 DIAGNOSIS — N1831 Chronic kidney disease, stage 3a: Secondary | ICD-10-CM | POA: Diagnosis not present

## 2021-04-05 DIAGNOSIS — M810 Age-related osteoporosis without current pathological fracture: Secondary | ICD-10-CM | POA: Diagnosis not present

## 2021-04-05 DIAGNOSIS — D509 Iron deficiency anemia, unspecified: Secondary | ICD-10-CM | POA: Diagnosis not present

## 2021-04-05 DIAGNOSIS — I129 Hypertensive chronic kidney disease with stage 1 through stage 4 chronic kidney disease, or unspecified chronic kidney disease: Secondary | ICD-10-CM | POA: Diagnosis not present

## 2021-04-05 DIAGNOSIS — F319 Bipolar disorder, unspecified: Secondary | ICD-10-CM | POA: Diagnosis not present

## 2021-04-05 DIAGNOSIS — E785 Hyperlipidemia, unspecified: Secondary | ICD-10-CM | POA: Diagnosis not present

## 2021-04-05 DIAGNOSIS — E039 Hypothyroidism, unspecified: Secondary | ICD-10-CM | POA: Diagnosis not present

## 2021-05-06 DIAGNOSIS — E039 Hypothyroidism, unspecified: Secondary | ICD-10-CM | POA: Diagnosis not present

## 2021-05-06 DIAGNOSIS — F319 Bipolar disorder, unspecified: Secondary | ICD-10-CM | POA: Diagnosis not present

## 2021-05-06 DIAGNOSIS — M810 Age-related osteoporosis without current pathological fracture: Secondary | ICD-10-CM | POA: Diagnosis not present

## 2021-05-06 DIAGNOSIS — E785 Hyperlipidemia, unspecified: Secondary | ICD-10-CM | POA: Diagnosis not present

## 2021-05-06 DIAGNOSIS — I129 Hypertensive chronic kidney disease with stage 1 through stage 4 chronic kidney disease, or unspecified chronic kidney disease: Secondary | ICD-10-CM | POA: Diagnosis not present

## 2021-05-06 DIAGNOSIS — D509 Iron deficiency anemia, unspecified: Secondary | ICD-10-CM | POA: Diagnosis not present

## 2021-05-06 DIAGNOSIS — N1831 Chronic kidney disease, stage 3a: Secondary | ICD-10-CM | POA: Diagnosis not present

## 2021-06-03 DIAGNOSIS — I129 Hypertensive chronic kidney disease with stage 1 through stage 4 chronic kidney disease, or unspecified chronic kidney disease: Secondary | ICD-10-CM | POA: Diagnosis not present

## 2021-06-03 DIAGNOSIS — E039 Hypothyroidism, unspecified: Secondary | ICD-10-CM | POA: Diagnosis not present

## 2021-06-03 DIAGNOSIS — E785 Hyperlipidemia, unspecified: Secondary | ICD-10-CM | POA: Diagnosis not present

## 2021-06-26 DIAGNOSIS — F4310 Post-traumatic stress disorder, unspecified: Secondary | ICD-10-CM | POA: Diagnosis not present

## 2021-06-26 DIAGNOSIS — F411 Generalized anxiety disorder: Secondary | ICD-10-CM | POA: Diagnosis not present

## 2021-06-26 DIAGNOSIS — F315 Bipolar disorder, current episode depressed, severe, with psychotic features: Secondary | ICD-10-CM | POA: Diagnosis not present

## 2021-07-23 DIAGNOSIS — Z01419 Encounter for gynecological examination (general) (routine) without abnormal findings: Secondary | ICD-10-CM | POA: Diagnosis not present

## 2021-07-23 DIAGNOSIS — Z6824 Body mass index (BMI) 24.0-24.9, adult: Secondary | ICD-10-CM | POA: Diagnosis not present

## 2021-07-23 DIAGNOSIS — Z01411 Encounter for gynecological examination (general) (routine) with abnormal findings: Secondary | ICD-10-CM | POA: Diagnosis not present

## 2021-07-23 DIAGNOSIS — Z0142 Encounter for cervical smear to confirm findings of recent normal smear following initial abnormal smear: Secondary | ICD-10-CM | POA: Diagnosis not present

## 2021-07-23 DIAGNOSIS — Z1231 Encounter for screening mammogram for malignant neoplasm of breast: Secondary | ICD-10-CM | POA: Diagnosis not present

## 2021-07-23 DIAGNOSIS — M810 Age-related osteoporosis without current pathological fracture: Secondary | ICD-10-CM | POA: Diagnosis not present

## 2021-07-23 DIAGNOSIS — Z124 Encounter for screening for malignant neoplasm of cervix: Secondary | ICD-10-CM | POA: Diagnosis not present

## 2021-07-23 DIAGNOSIS — Z8041 Family history of malignant neoplasm of ovary: Secondary | ICD-10-CM | POA: Diagnosis not present

## 2021-08-01 DIAGNOSIS — I129 Hypertensive chronic kidney disease with stage 1 through stage 4 chronic kidney disease, or unspecified chronic kidney disease: Secondary | ICD-10-CM | POA: Diagnosis not present

## 2021-08-01 DIAGNOSIS — E785 Hyperlipidemia, unspecified: Secondary | ICD-10-CM | POA: Diagnosis not present

## 2021-08-01 DIAGNOSIS — E039 Hypothyroidism, unspecified: Secondary | ICD-10-CM | POA: Diagnosis not present

## 2021-08-26 DIAGNOSIS — F4310 Post-traumatic stress disorder, unspecified: Secondary | ICD-10-CM | POA: Diagnosis not present

## 2021-08-26 DIAGNOSIS — F411 Generalized anxiety disorder: Secondary | ICD-10-CM | POA: Diagnosis not present

## 2021-08-26 DIAGNOSIS — F315 Bipolar disorder, current episode depressed, severe, with psychotic features: Secondary | ICD-10-CM | POA: Diagnosis not present

## 2021-09-17 DIAGNOSIS — I129 Hypertensive chronic kidney disease with stage 1 through stage 4 chronic kidney disease, or unspecified chronic kidney disease: Secondary | ICD-10-CM | POA: Diagnosis not present

## 2021-09-17 DIAGNOSIS — I10 Essential (primary) hypertension: Secondary | ICD-10-CM | POA: Diagnosis not present

## 2021-09-17 DIAGNOSIS — E785 Hyperlipidemia, unspecified: Secondary | ICD-10-CM | POA: Diagnosis not present

## 2021-09-17 DIAGNOSIS — E039 Hypothyroidism, unspecified: Secondary | ICD-10-CM | POA: Diagnosis not present

## 2021-10-01 DIAGNOSIS — E039 Hypothyroidism, unspecified: Secondary | ICD-10-CM | POA: Diagnosis not present

## 2021-10-01 DIAGNOSIS — I129 Hypertensive chronic kidney disease with stage 1 through stage 4 chronic kidney disease, or unspecified chronic kidney disease: Secondary | ICD-10-CM | POA: Diagnosis not present

## 2021-10-01 DIAGNOSIS — E785 Hyperlipidemia, unspecified: Secondary | ICD-10-CM | POA: Diagnosis not present

## 2021-11-01 ENCOUNTER — Ambulatory Visit (INDEPENDENT_AMBULATORY_CARE_PROVIDER_SITE_OTHER): Payer: PPO

## 2021-11-01 ENCOUNTER — Ambulatory Visit
Admission: EM | Admit: 2021-11-01 | Discharge: 2021-11-01 | Disposition: A | Discharge status: Home / Self Care | Payer: PPO | Attending: Internal Medicine | Admitting: Internal Medicine

## 2021-11-01 DIAGNOSIS — M19042 Primary osteoarthritis, left hand: Secondary | ICD-10-CM

## 2021-11-01 DIAGNOSIS — M79642 Pain in left hand: Secondary | ICD-10-CM

## 2021-11-01 MED ORDER — DICLOFENAC SODIUM 1 % EX GEL
2.0000 g | Freq: Four times a day (QID) | CUTANEOUS | 0 refills | Status: AC
Start: 2021-11-01 — End: ?

## 2021-11-01 NOTE — ED Triage Notes (Signed)
Pt c/o left hand pain onset ~ 2 days ago. Pt is ambiguous in her description it seems the entire hand is hurting. States she thinks it is broken but denies injury or trauma.

## 2021-11-01 NOTE — Discharge Instructions (Addendum)
Please apply the diclofenac gel to the affected areas every 6 hours as needed Rest and elevate the affected painful area.   Apply cold compresses intermittently as needed.   As pain recedes, begin normal activities slowly as tolerated.   Return to urgent care if symptoms persist.

## 2021-11-04 DIAGNOSIS — F319 Bipolar disorder, unspecified: Secondary | ICD-10-CM | POA: Diagnosis not present

## 2021-11-04 DIAGNOSIS — N1831 Chronic kidney disease, stage 3a: Secondary | ICD-10-CM | POA: Diagnosis not present

## 2021-11-04 DIAGNOSIS — Z0000 Encounter for general adult medical examination without abnormal findings: Secondary | ICD-10-CM | POA: Diagnosis not present

## 2021-11-04 DIAGNOSIS — E871 Hypo-osmolality and hyponatremia: Secondary | ICD-10-CM | POA: Diagnosis not present

## 2021-11-04 DIAGNOSIS — E785 Hyperlipidemia, unspecified: Secondary | ICD-10-CM | POA: Diagnosis not present

## 2021-11-04 DIAGNOSIS — M810 Age-related osteoporosis without current pathological fracture: Secondary | ICD-10-CM | POA: Diagnosis not present

## 2021-11-04 DIAGNOSIS — Z8673 Personal history of transient ischemic attack (TIA), and cerebral infarction without residual deficits: Secondary | ICD-10-CM | POA: Diagnosis not present

## 2021-11-04 DIAGNOSIS — I129 Hypertensive chronic kidney disease with stage 1 through stage 4 chronic kidney disease, or unspecified chronic kidney disease: Secondary | ICD-10-CM | POA: Diagnosis not present

## 2021-11-04 DIAGNOSIS — E039 Hypothyroidism, unspecified: Secondary | ICD-10-CM | POA: Diagnosis not present

## 2021-11-04 DIAGNOSIS — Z1331 Encounter for screening for depression: Secondary | ICD-10-CM | POA: Diagnosis not present

## 2021-11-05 NOTE — ED Provider Notes (Signed)
[facility]    CSN: [id number] Arrival date & time: 11/01/21  1421      History   Chief Complaint Chief Complaint  Patient presents with   left hand pain    HPI ANALISA SLEDD is a 81 y.o. female to the urgent care accompanied by her husband on account of left hand pain which started a couple of days ago.  Patient describes the pain as sharp, throbbing, aggravated by movement and denies any relieving factors.  She denies any trauma or injury to the hand.  No heavy lifting.  Pain is associated with mild swelling of the second and third metacarpophalangeal joints of the left hand.  No history of rheumatoid arthritis. HPI  Past Medical History:  Diagnosis Date   Anemia    Cancer ([place])    Depression    Heart murmur    High cholesterol    Hypertension    Osteoporosis    Panic attack    Stroke [facility])    Thyroid disease     Patient Active Problem List   Diagnosis Date Noted   Rectal prolapse 03/15/2021   Thyroid disease    Hypertension    High cholesterol    Hyponatremia 02/12/2013   Acute encephalopathy 02/12/2013   Osteoporosis    Panic attack     Past Surgical History:  Procedure Laterality Date   CATARACT EXTRACTION     XI ROBOT ASSISTED RECTOPEXY N/A 03/15/2021   Procedure: XI ROBOT ASSISTED RECTOPEXY;  Surgeon: Leighton Ruff, MD;  Location: [facility];  Service: General;  Laterality: N/A;    OB History   No obstetric history on file.      Home Medications    Prior to Admission medications   Medication Sig Start Date End Date Taking? Authorizing Provider  diclofenac Sodium (VOLTAREN) 1 % GEL Apply 2 g topically 4 (four) times daily. 11/01/21  Yes Kristoph Sattler, Myrene Galas, MD  alendronate (FOSAMAX) 70 MG tablet Take 70 mg by mouth every Friday. Take with a full glass of water on an empty stomach.    [provider]  amLODipine (NORVASC) 10 MG tablet Take 10 mg by mouth daily. 04/24/16   [provider]  Calcium Carb-Cholecalciferol  600-20 MG-MCG CHEW Chew 1 tablet by mouth daily.    [provider]  CRANBERRY PO Take 500 mg by mouth daily.    [provider]  dipyridamole-aspirin (AGGRENOX) 200-25 MG per 12 hr capsule Take 1 capsule by mouth 2 (two) times daily.    [provider]  docusate sodium (COLACE) 100 MG capsule Take 100 mg by mouth 2 (two) times daily.    [provider]  ESTRACE VAGINAL 0.1 MG/GM vaginal cream Place 2 g vaginally daily as needed (dryness). 02/02/13   [provider]  fluticasone (FLONASE) 50 MCG/ACT nasal spray Place 2 sprays into the nose daily.    [provider]  hydrocortisone (ANUSOL-HC) 2.5 % rectal cream Place 1 application rectally 2 (two) times daily. Patient taking differently: Place 1 application rectally daily as needed for hemorrhoids or anal itching. 10/22/20   Caccavale, Sophia, PA-C  IRON PO Take 45 mg by mouth every evening.    [provider]  lamoTRIgine (LAMICTAL) 25 MG tablet Take 50 mg by mouth daily before breakfast. 05/09/16   [provider]  levocetirizine (XYZAL) 5 MG tablet Take 5 mg by mouth every evening.    [provider]  levothyroxine (SYNTHROID, LEVOTHROID) 50 MCG tablet Take 50  mcg by mouth daily before breakfast.    [provider]  lisinopril (PRINIVIL,ZESTRIL) 20 MG tablet Take 1 tablet (20 mg total) by mouth daily. Patient taking differently: Take 40 mg by mouth every morning. 02/13/13   Verlee Monte, MD  magnesium oxide (MAG-OX) 400 (240 Mg) MG tablet Take 1 tablet (400 mg total) by mouth daily. 26/94/85   Leighton Ruff, MD  Multiple Vitamin (MULTIVITAMIN WITH MINERALS) TABS tablet Take 1 tablet by mouth daily. 50 plus    [provider]  Naphazoline-Pheniramine (OPCON-A) 0.027-0.315 % SOLN Place 1 drop into both eyes daily as needed (Dry eyes).    [provider]  Omega-3 Fatty Acids (FISH OIL) 500 MG CAPS Take 500 mg by mouth daily.    [provider]  polyethylene glycol (MIRALAX / GLYCOLAX) packet Take 17 g by mouth daily as needed. Patient taking differently: Take 17 g by mouth daily. 02/28/13   Hosie Poisson, MD  QUEtiapine (SEROQUEL) 100 MG tablet Take 100 mg by mouth at bedtime. 04/22/16   [provider]  simvastatin (ZOCOR) 20 MG tablet Take 20 mg by mouth every evening. 10/11/20   [provider]  traMADol (ULTRAM) 50 MG tablet Take 1 tablet (50 mg total) by mouth every 6 (six) hours as needed for moderate pain. 46/27/03   Leighton Ruff, MD    Family History History reviewed. No pertinent family history.  Social History Social History   Tobacco Use   Smoking status: Never   Smokeless tobacco: Never  Vaping Use   Vaping Use: Never used  Substance Use Topics   Alcohol use: No   Drug use: No     Allergies   Sulfa antibiotics and Latex   Review of Systems Review of Systems  As per HPI Physical Exam Triage Vital Signs ED Triage Vitals [11/01/21 1445]  Enc Vitals Group     BP (!) 150/70     Pulse Rate (!) 55     Resp 18     Temp 98 F (36.7 C)     Temp Source Oral     SpO2 95 %     Weight      Height      Head Circumference      Peak Flow      Pain Score 0     Pain Loc      Pain Edu?      Excl. in [place]?    No data found.  Updated Vital Signs BP (!) 150/70 (BP Location: Right Arm)   Pulse (!) 55   Temp 98 F (36.7 C) (Oral)   Resp 18   SpO2 95%   Visual Acuity Right Eye Distance:   Left Eye Distance:   Bilateral Distance:    Right Eye Near:   Left Eye Near:    Bilateral Near:     Physical Exam Vitals and nursing note reviewed.  Constitutional:      General: She is not in acute distress.    Appearance: She is not ill-appearing.  Cardiovascular:     Rate and Rhythm: Normal rate and regular rhythm.  Pulmonary:     Effort: Pulmonary effort is normal.     Breath sounds: Normal breath sounds.  Abdominal:     General: Bowel sounds are normal.      Palpations: Abdomen is soft.  Musculoskeletal:     Comments: Tenderness on palpation of the second and third metacarpophalangeal joints of the left hand.  No joint  swelling.  Erythema overlying both joints.  Full range of motion of the left hand.  Skin:    Capillary Refill: Capillary refill takes less than 2 seconds.     Findings: No bruising or erythema.  Neurological:     Mental Status: She is alert.      UC Treatments / Results  Labs (all labs ordered are listed, but only abnormal results are displayed) Labs Reviewed - No data to display  EKG   Radiology No results found.  Procedures Procedures (including critical care time)  Medications Ordered in UC Medications - No data to display  Initial Impression / Assessment and Plan / UC Course  I have reviewed the triage vital signs and the nursing notes.  Pertinent labs & imaging results that were available during my care of the patient were reviewed by me and considered in my medical decision making (see chart for details).     1.  Osteoarthritis of the left hand: X-ray of the left hand is negative for fracture Elevation of the left hand, icing and to range of motion exercises as pain improves Diclofenac gel on the left hand. Return precautions given. Final Clinical Impressions(s) / UC Diagnoses   Final diagnoses:  Osteoarthritis of left hand, unspecified osteoarthritis type     Discharge Instructions      Please apply the diclofenac gel to the affected areas every 6 hours as needed Rest and elevate the affected painful area.   Apply cold compresses intermittently as needed.   As pain recedes, begin normal activities slowly as tolerated.   Return to urgent care if symptoms persist.    ED Prescriptions     Medication Sig Dispense Auth. Provider   diclofenac Sodium (VOLTAREN) 1 % GEL Apply 2 g topically 4 (four) times daily. 150 g Mckenzy Salazar, Myrene Galas, MD      PDMP not reviewed this encounter.   Chase Picket, MD 11/05/21 1501

## 2021-12-26 DIAGNOSIS — E039 Hypothyroidism, unspecified: Secondary | ICD-10-CM | POA: Diagnosis not present

## 2021-12-26 DIAGNOSIS — F315 Bipolar disorder, current episode depressed, severe, with psychotic features: Secondary | ICD-10-CM | POA: Diagnosis not present

## 2021-12-26 DIAGNOSIS — E785 Hyperlipidemia, unspecified: Secondary | ICD-10-CM | POA: Diagnosis not present

## 2021-12-26 DIAGNOSIS — F319 Bipolar disorder, unspecified: Secondary | ICD-10-CM | POA: Diagnosis not present

## 2021-12-26 DIAGNOSIS — F4310 Post-traumatic stress disorder, unspecified: Secondary | ICD-10-CM | POA: Diagnosis not present

## 2021-12-26 DIAGNOSIS — I129 Hypertensive chronic kidney disease with stage 1 through stage 4 chronic kidney disease, or unspecified chronic kidney disease: Secondary | ICD-10-CM | POA: Diagnosis not present

## 2021-12-26 DIAGNOSIS — F411 Generalized anxiety disorder: Secondary | ICD-10-CM | POA: Diagnosis not present

## 2021-12-26 DIAGNOSIS — N1831 Chronic kidney disease, stage 3a: Secondary | ICD-10-CM | POA: Diagnosis not present

## 2021-12-26 DIAGNOSIS — M810 Age-related osteoporosis without current pathological fracture: Secondary | ICD-10-CM | POA: Diagnosis not present

## 2022-02-22 IMAGING — CT CT ABD-PELV W/ CM
2 of 5 series · 16 of 46 positions shown, 18 images · IV contrast (omnipaque)
Comparison: CT abdomen pelvis 10/22/2020

CLINICAL DATA: Diarrhea. History of pancolitis. Recurrent rectal
prolapse.

EXAM:
CT ABDOMEN AND PELVIS WITH CONTRAST
TECHNIQUE: Multidetector CT imaging of the abdomen and pelvis was performed
using the standard protocol following bolus administration of
intravenous contrast.
CONTRAST:  80mL OMNIPAQUE IOHEXOL 350 MG/ML SOLN

[Series 2: axial st · axial · 0.78mm/px · z∈[+776,+1142]mm · 13 of 87 slices shown, 15 images]
[im 7/87  soft-tissue]
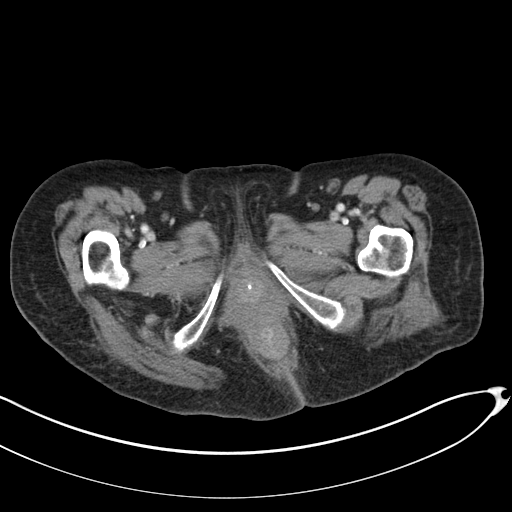
[im 7/87  bone]
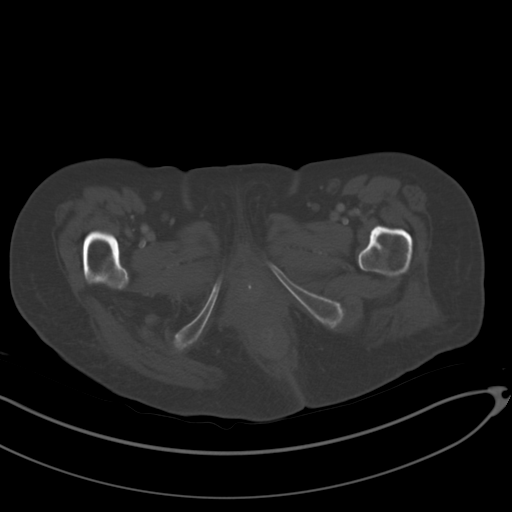
[im 13/87  soft-tissue]
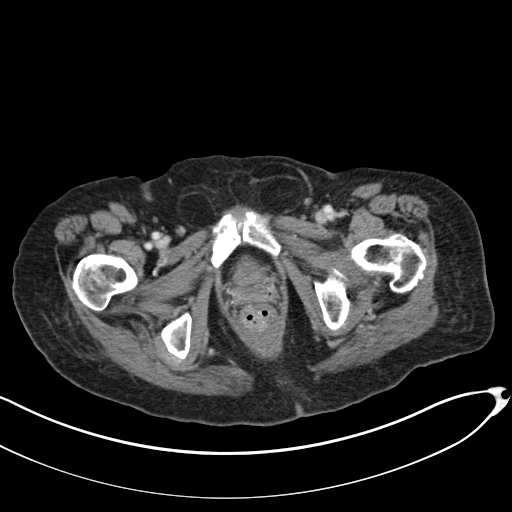
[im 19/87  soft-tissue]
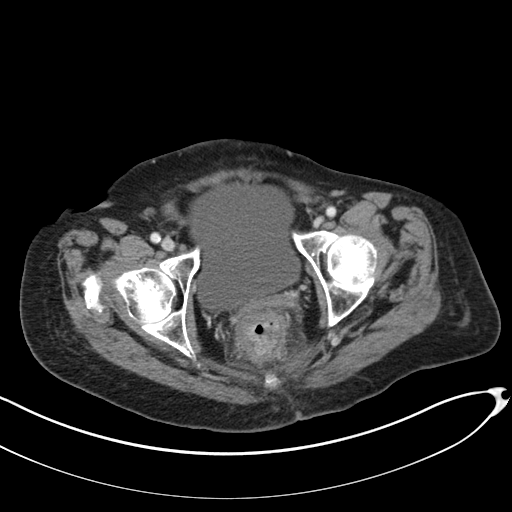
[im 25/87  soft-tissue]
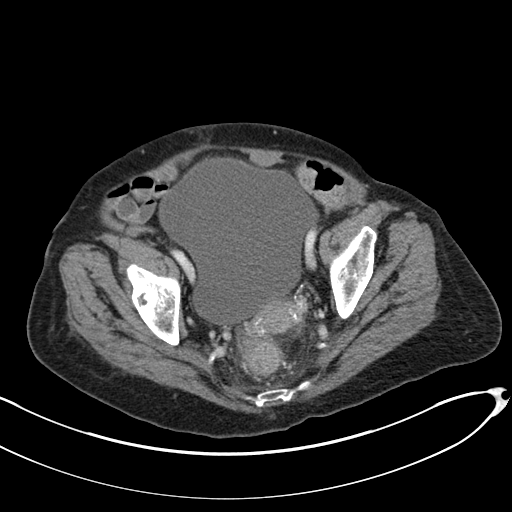
[im 31/87  soft-tissue]
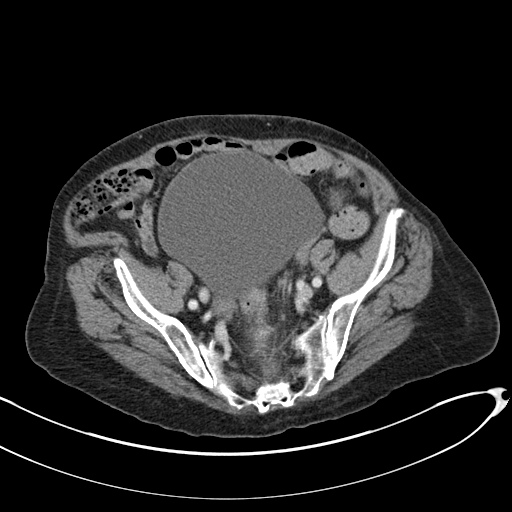
[im 37/87  soft-tissue]
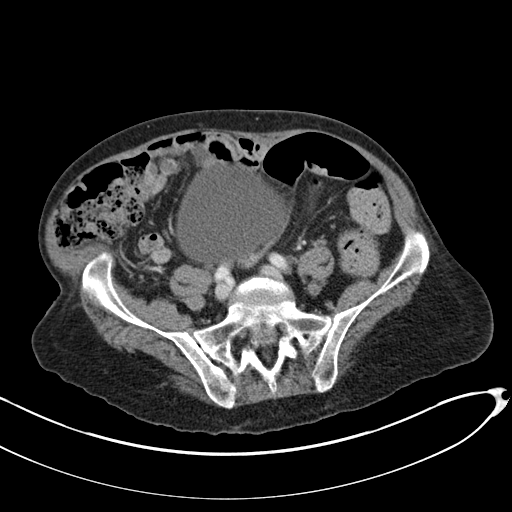
[im 44/87  soft-tissue]
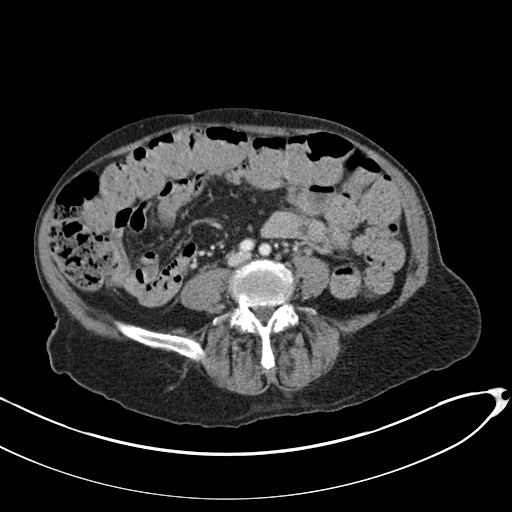
[im 50/87  soft-tissue]
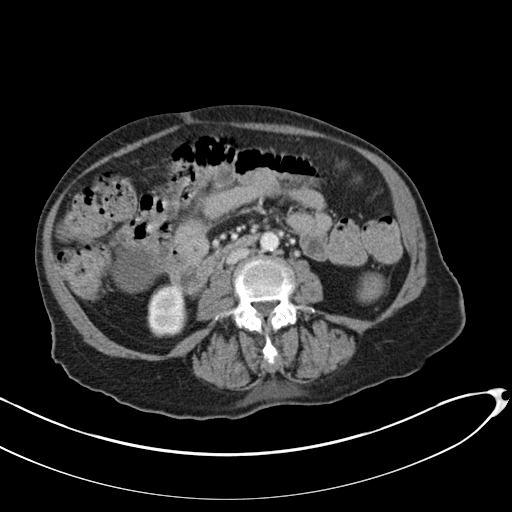
[im 56/87  soft-tissue]
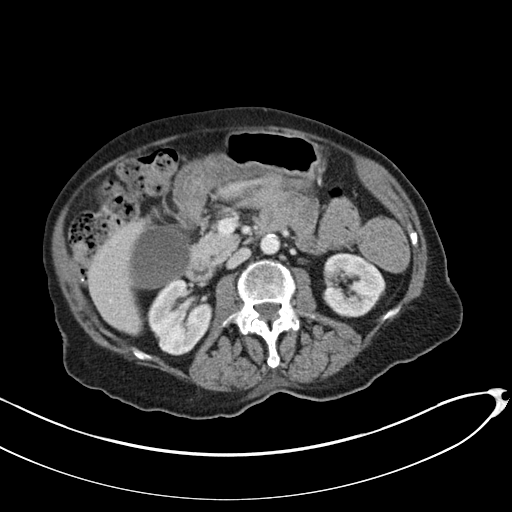
[im 56/87  bone]
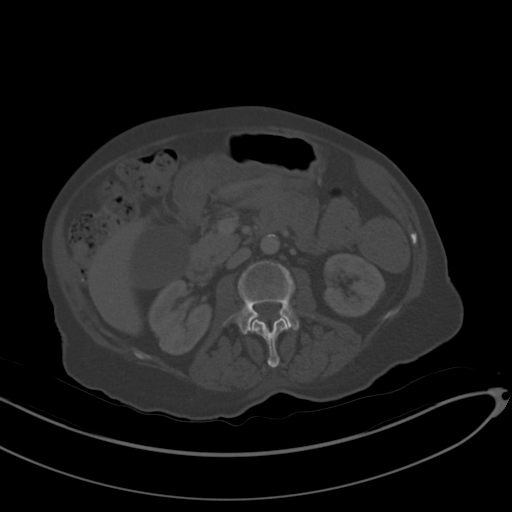
[im 62/87  soft-tissue]
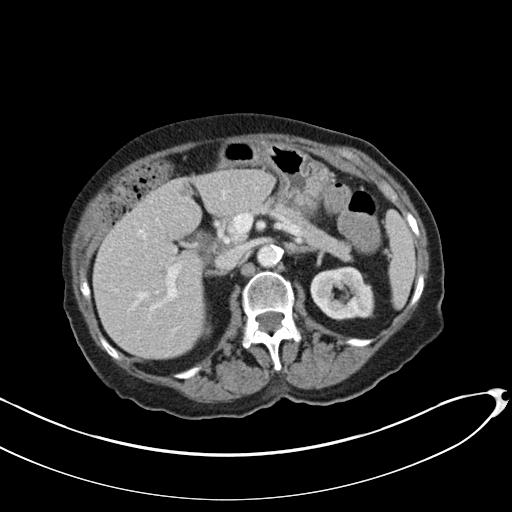
[im 68/87  soft-tissue]
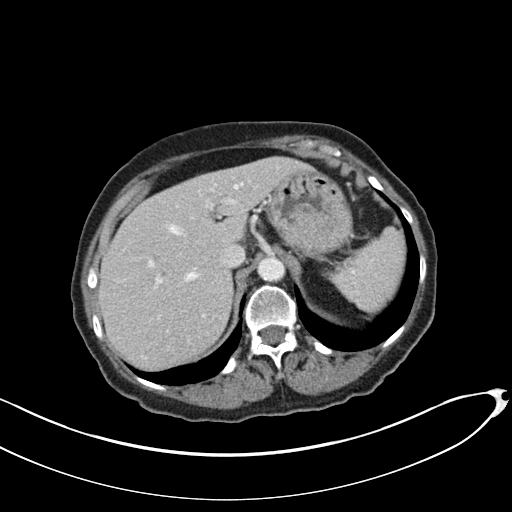
[im 74/87  soft-tissue]
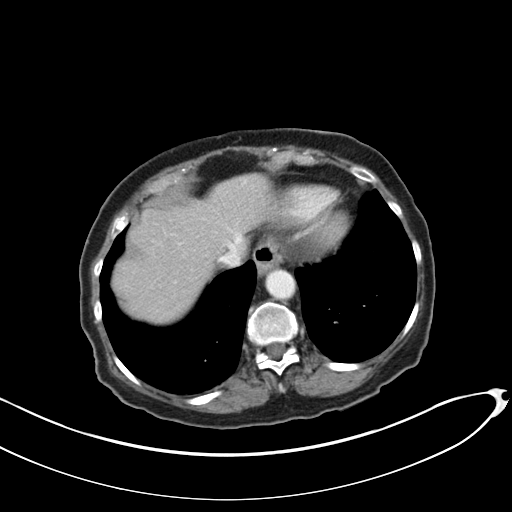
[im 80/87  soft-tissue]
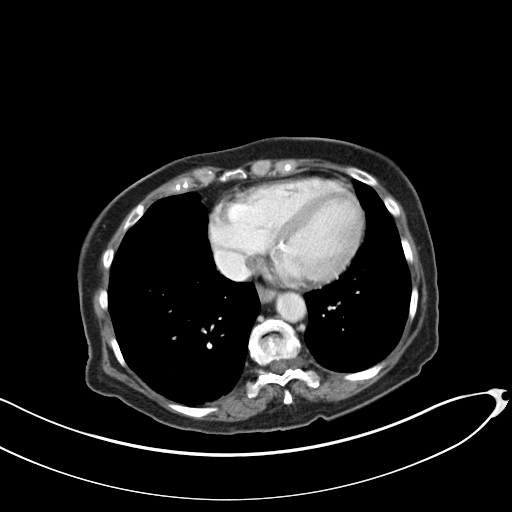

[Series 5: coronal st · coronal · 0.69mm/px · 3 of 133 slices shown]
[im 45/133  soft-tissue]
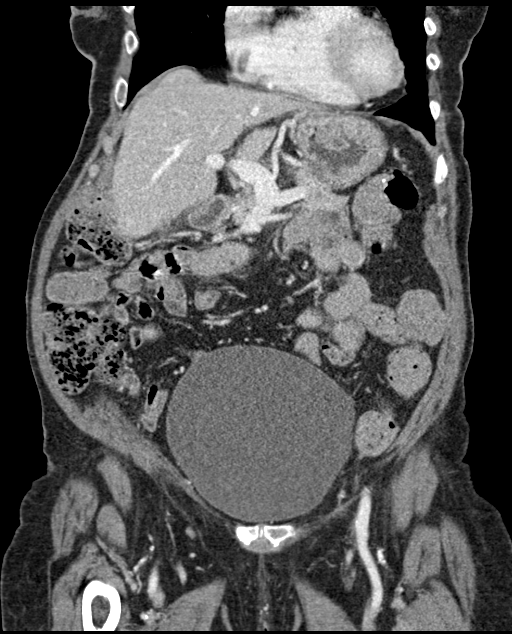
[im 59/133  soft-tissue]
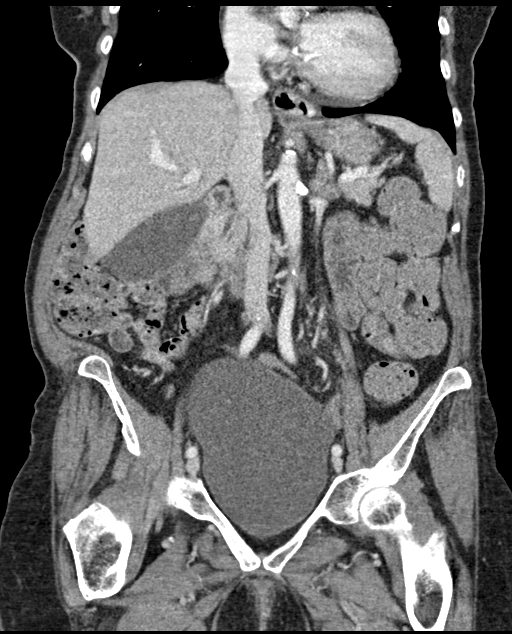
[im 74/133  soft-tissue]
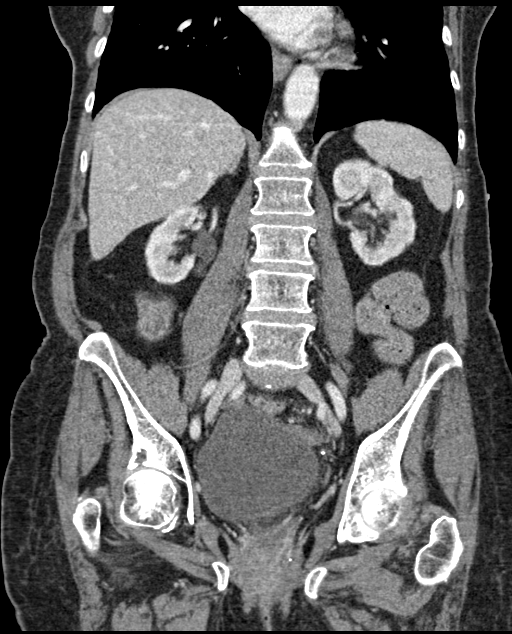

[16 of 46 positions shown; findings below may reference images not displayed]

FINDINGS: Lower chest: No acute abnormality.  Tiny hiatal hernia.

Hepatobiliary: No focal liver abnormality. No gallstones,
gallbladder wall thickening, or pericholecystic fluid. No biliary
dilatation.

Pancreas: No focal lesion. Normal pancreatic contour. No surrounding
inflammatory changes. No main pancreatic ductal dilatation.

Spleen: Normal in size without focal abnormality.

Adrenals/Urinary Tract:

No adrenal nodule bilaterally.

Bilateral kidneys enhance symmetrically. No hydronephrosis. No
hydroureter.

The urinary bladder is distended with urine.

On delayed imaging, there is no urothelial wall thickening and there
are no filling defects in the opacified portions of the bilateral
collecting systems or ureters.

Stomach/Bowel: Stomach is within normal limits. No evidence of small
bowel wall thickening or dilatation. Redemonstration of rectal wall
thickening, mucosal hyperemia, and rectal fat stranding. Appendix
appears normal.

Vascular/Lymphatic: No abdominal aorta or iliac aneurysm. Mild
atherosclerotic plaque of the aorta and its branches. No abdominal,
pelvic, or inguinal lymphadenopathy.

Reproductive: Uterus and bilateral adnexa are unremarkable.

Other: No intraperitoneal free fluid. No intraperitoneal free gas.
No organized fluid collection.

Musculoskeletal:

No abdominal wall hernia or abnormality.

No suspicious lytic or blastic osseous lesions. Similar benign
appearing subchondral cystic lesion of the right acetabula likely
related to degenerative changes. No acute displaced fracture.
Multilevel degenerative changes of the spine.
IMPRESSION: 1. Proctitis.
2. Tiny hiatal hernia.
3.  Aortic Atherosclerosis ([id number]).

## 2022-04-02 DIAGNOSIS — Z23 Encounter for immunization: Secondary | ICD-10-CM | POA: Diagnosis not present

## 2022-04-17 DIAGNOSIS — F4310 Post-traumatic stress disorder, unspecified: Secondary | ICD-10-CM | POA: Diagnosis not present

## 2022-04-17 DIAGNOSIS — F411 Generalized anxiety disorder: Secondary | ICD-10-CM | POA: Diagnosis not present

## 2022-04-17 DIAGNOSIS — F315 Bipolar disorder, current episode depressed, severe, with psychotic features: Secondary | ICD-10-CM | POA: Diagnosis not present

## 2022-05-06 DIAGNOSIS — I700 Atherosclerosis of aorta: Secondary | ICD-10-CM | POA: Diagnosis not present

## 2022-05-06 DIAGNOSIS — F319 Bipolar disorder, unspecified: Secondary | ICD-10-CM | POA: Diagnosis not present

## 2022-05-06 DIAGNOSIS — E785 Hyperlipidemia, unspecified: Secondary | ICD-10-CM | POA: Diagnosis not present

## 2022-05-06 DIAGNOSIS — I129 Hypertensive chronic kidney disease with stage 1 through stage 4 chronic kidney disease, or unspecified chronic kidney disease: Secondary | ICD-10-CM | POA: Diagnosis not present

## 2022-05-06 DIAGNOSIS — E039 Hypothyroidism, unspecified: Secondary | ICD-10-CM | POA: Diagnosis not present

## 2022-05-06 DIAGNOSIS — N1831 Chronic kidney disease, stage 3a: Secondary | ICD-10-CM | POA: Diagnosis not present

## 2022-07-07 IMAGING — DX DG ABD PORTABLE 1V
1 series · 1 of 1 positions shown · non-contrast
Comparison: October 04, 2005

CLINICAL DATA: Ileus

EXAM:
PORTABLE ABDOMEN - 1 VIEW

[abdomen kub]
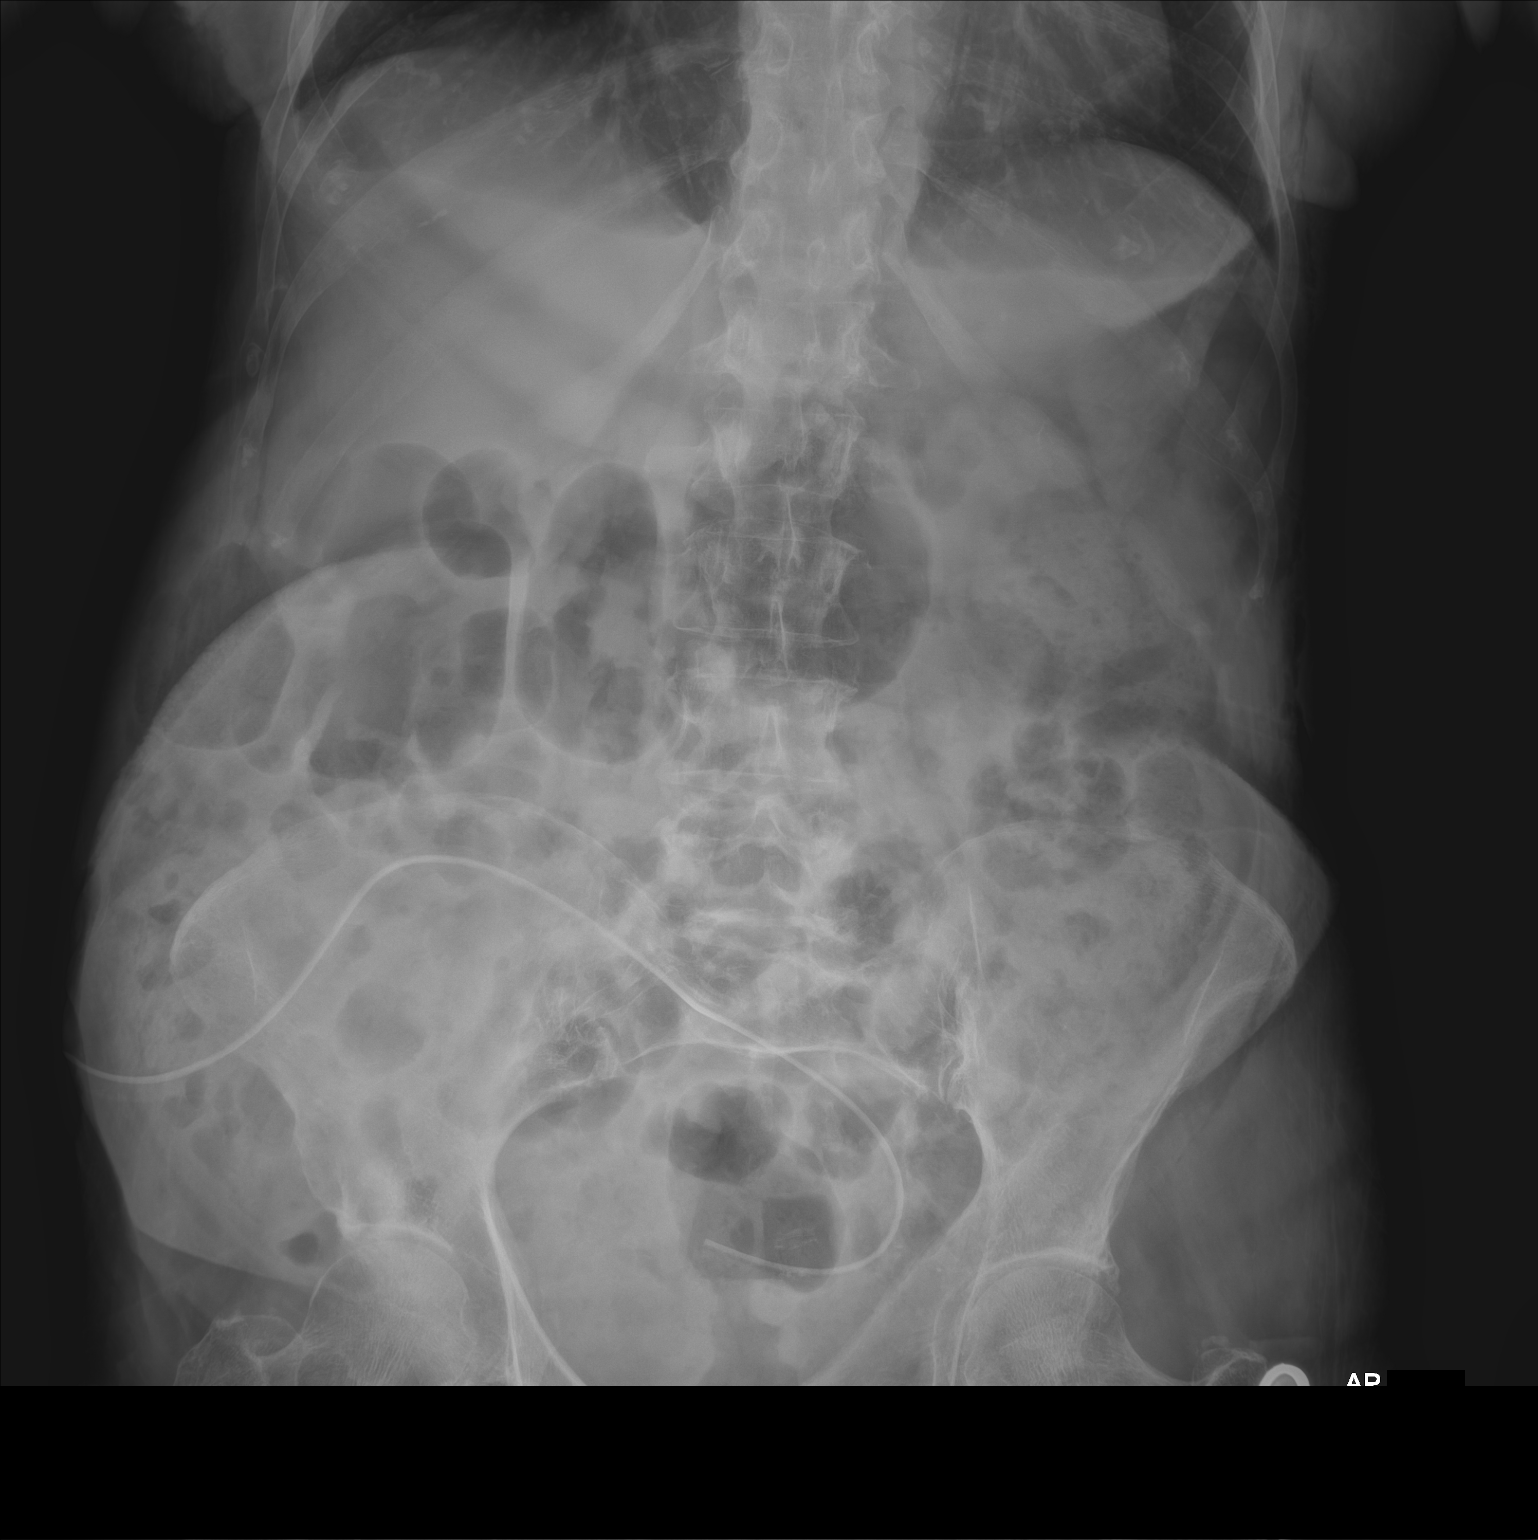

[1 of 1 positions shown; findings below may reference images not displayed]

FINDINGS: Some sort a catheter overlies the right and central pelvis. The
bowel gas pattern is nonobstructive. No convincing ileus. No other
abnormalities.
IMPRESSION: 1. Some sort a catheter overlies the pelvis.
2. No evidence of ileus or obstruction.

## 2022-10-17 DIAGNOSIS — Z01411 Encounter for gynecological examination (general) (routine) with abnormal findings: Secondary | ICD-10-CM | POA: Diagnosis not present

## 2022-10-17 DIAGNOSIS — R140 Abdominal distension (gaseous): Secondary | ICD-10-CM | POA: Diagnosis not present

## 2022-10-17 DIAGNOSIS — Z6826 Body mass index (BMI) 26.0-26.9, adult: Secondary | ICD-10-CM | POA: Diagnosis not present

## 2022-10-17 DIAGNOSIS — Z1231 Encounter for screening mammogram for malignant neoplasm of breast: Secondary | ICD-10-CM | POA: Diagnosis not present

## 2022-10-17 DIAGNOSIS — Z01419 Encounter for gynecological examination (general) (routine) without abnormal findings: Secondary | ICD-10-CM | POA: Diagnosis not present

## 2022-10-17 DIAGNOSIS — Z124 Encounter for screening for malignant neoplasm of cervix: Secondary | ICD-10-CM | POA: Diagnosis not present

## 2022-11-07 DIAGNOSIS — R140 Abdominal distension (gaseous): Secondary | ICD-10-CM | POA: Diagnosis not present

## 2022-11-21 DIAGNOSIS — E871 Hypo-osmolality and hyponatremia: Secondary | ICD-10-CM | POA: Diagnosis not present

## 2022-11-21 DIAGNOSIS — F319 Bipolar disorder, unspecified: Secondary | ICD-10-CM | POA: Diagnosis not present

## 2022-11-21 DIAGNOSIS — M810 Age-related osteoporosis without current pathological fracture: Secondary | ICD-10-CM | POA: Diagnosis not present

## 2022-11-21 DIAGNOSIS — Z8673 Personal history of transient ischemic attack (TIA), and cerebral infarction without residual deficits: Secondary | ICD-10-CM | POA: Diagnosis not present

## 2022-11-21 DIAGNOSIS — E785 Hyperlipidemia, unspecified: Secondary | ICD-10-CM | POA: Diagnosis not present

## 2022-11-21 DIAGNOSIS — Z1331 Encounter for screening for depression: Secondary | ICD-10-CM | POA: Diagnosis not present

## 2022-11-21 DIAGNOSIS — I129 Hypertensive chronic kidney disease with stage 1 through stage 4 chronic kidney disease, or unspecified chronic kidney disease: Secondary | ICD-10-CM | POA: Diagnosis not present

## 2022-11-21 DIAGNOSIS — N1831 Chronic kidney disease, stage 3a: Secondary | ICD-10-CM | POA: Diagnosis not present

## 2022-11-21 DIAGNOSIS — E440 Moderate protein-calorie malnutrition: Secondary | ICD-10-CM | POA: Diagnosis not present

## 2022-11-21 DIAGNOSIS — Z0000 Encounter for general adult medical examination without abnormal findings: Secondary | ICD-10-CM | POA: Diagnosis not present

## 2022-11-21 DIAGNOSIS — E039 Hypothyroidism, unspecified: Secondary | ICD-10-CM | POA: Diagnosis not present

## 2022-12-03 DIAGNOSIS — M8588 Other specified disorders of bone density and structure, other site: Secondary | ICD-10-CM | POA: Diagnosis not present

## 2022-12-03 DIAGNOSIS — Z8262 Family history of osteoporosis: Secondary | ICD-10-CM | POA: Diagnosis not present

## 2022-12-15 ENCOUNTER — Other Ambulatory Visit (HOSPITAL_COMMUNITY): Payer: Self-pay

## 2022-12-19 ENCOUNTER — Other Ambulatory Visit (HOSPITAL_COMMUNITY): Payer: Self-pay

## 2022-12-23 ENCOUNTER — Other Ambulatory Visit (HOSPITAL_COMMUNITY): Payer: Self-pay

## 2022-12-25 ENCOUNTER — Other Ambulatory Visit (HOSPITAL_COMMUNITY): Payer: Self-pay

## 2022-12-26 ENCOUNTER — Other Ambulatory Visit (HOSPITAL_COMMUNITY): Payer: Self-pay

## 2022-12-31 ENCOUNTER — Other Ambulatory Visit (HOSPITAL_COMMUNITY): Payer: Self-pay

## 2023-01-01 ENCOUNTER — Other Ambulatory Visit (HOSPITAL_COMMUNITY): Payer: Self-pay

## 2023-01-03 ENCOUNTER — Other Ambulatory Visit: Payer: Self-pay

## 2023-01-05 ENCOUNTER — Other Ambulatory Visit (HOSPITAL_COMMUNITY): Payer: Self-pay

## 2023-01-05 ENCOUNTER — Other Ambulatory Visit: Payer: Self-pay

## 2023-01-06 ENCOUNTER — Other Ambulatory Visit (HOSPITAL_COMMUNITY): Payer: Self-pay

## 2023-01-06 ENCOUNTER — Other Ambulatory Visit: Payer: Self-pay

## 2023-01-06 MED ORDER — LEVOTHYROXINE SODIUM 50 MCG PO TABS
50.0000 ug | ORAL_TABLET | Freq: Every day | ORAL | 1 refills | Status: DC
  Filled 2023-01-06 – 2023-01-07 (×3): qty 30, 30d supply, fill #0
  Filled 2023-01-30 (×3): qty 30, 30d supply, fill #1
  Filled 2023-02-23: qty 30, 30d supply, fill #2
  Filled 2023-03-19 – 2023-03-20 (×2): qty 30, 30d supply, fill #3
  Filled 2023-04-16: qty 30, 30d supply, fill #4
  Filled 2023-05-12 – 2023-05-18 (×3): qty 30, 30d supply, fill #5

## 2023-01-06 MED ORDER — AMLODIPINE BESYLATE 10 MG PO TABS
10.0000 mg | ORAL_TABLET | Freq: Every morning | ORAL | 3 refills | Status: DC
  Filled 2023-01-06 – 2023-01-07 (×3): qty 30, 30d supply, fill #0
  Filled 2023-01-28 – 2023-01-30 (×4): qty 30, 30d supply, fill #1
  Filled 2023-02-23: qty 30, 30d supply, fill #2
  Filled 2023-03-19 – 2023-03-20 (×2): qty 30, 30d supply, fill #3

## 2023-01-06 MED ORDER — ASPIRIN-DIPYRIDAMOLE ER 25-200 MG PO CP12
1.0000 | ORAL_CAPSULE | Freq: Two times a day (BID) | ORAL | 3 refills | Status: DC
  Filled 2023-01-06 – 2023-01-07 (×3): qty 60, 30d supply, fill #0
  Filled 2023-01-30 (×3): qty 60, 30d supply, fill #1
  Filled 2023-02-23: qty 60, 30d supply, fill #2
  Filled 2023-03-19 – 2023-03-20 (×2): qty 60, 30d supply, fill #3

## 2023-01-06 MED ORDER — LAMOTRIGINE 25 MG PO TABS
50.0000 mg | ORAL_TABLET | Freq: Every day | ORAL | 3 refills | Status: DC
  Filled 2023-01-06 – 2023-01-07 (×3): qty 60, 30d supply, fill #0
  Filled 2023-01-30 (×3): qty 60, 30d supply, fill #1
  Filled 2023-02-23: qty 60, 30d supply, fill #2
  Filled 2023-03-19 – 2023-03-20 (×2): qty 60, 30d supply, fill #3

## 2023-01-06 MED ORDER — ALENDRONATE SODIUM 70 MG PO TABS
70.0000 mg | ORAL_TABLET | ORAL | 3 refills | Status: DC
  Filled 2023-01-06: qty 12, 84d supply, fill #0
  Filled 2023-01-07 (×2): qty 4, 28d supply, fill #0

## 2023-01-07 ENCOUNTER — Other Ambulatory Visit (HOSPITAL_COMMUNITY): Payer: Self-pay

## 2023-01-07 ENCOUNTER — Other Ambulatory Visit: Payer: Self-pay

## 2023-01-07 MED ORDER — QUETIAPINE FUMARATE 100 MG PO TABS
50.0000 mg | ORAL_TABLET | Freq: Every evening | ORAL | 3 refills | Status: AC
  Filled 2023-01-07 (×2): qty 15, 30d supply, fill #0
  Filled 2023-01-30 – 2023-02-09 (×4): qty 15, 30d supply, fill #1
  Filled 2023-03-02 – 2023-03-12 (×3): qty 15, 30d supply, fill #2
  Filled ????-??-??: fill #3

## 2023-01-07 MED ORDER — LISINOPRIL 40 MG PO TABS
40.0000 mg | ORAL_TABLET | Freq: Every morning | ORAL | 0 refills | Status: DC
  Filled 2023-01-07 (×2): qty 30, 30d supply, fill #0
  Filled 2023-01-30 (×3): qty 30, 30d supply, fill #1
  Filled 2023-02-23: qty 30, 30d supply, fill #2

## 2023-01-08 ENCOUNTER — Other Ambulatory Visit (HOSPITAL_COMMUNITY): Payer: Self-pay

## 2023-01-08 ENCOUNTER — Other Ambulatory Visit: Payer: Self-pay

## 2023-01-08 MED ORDER — SIMVASTATIN 20 MG PO TABS
20.0000 mg | ORAL_TABLET | Freq: Every evening | ORAL | 1 refills | Status: DC
  Filled 2023-01-08 (×2): qty 90, 90d supply, fill #0
  Filled 2023-01-08: qty 30, 30d supply, fill #0
  Filled 2023-03-19 – 2023-03-20 (×2): qty 30, 30d supply, fill #1
  Filled 2023-04-16: qty 30, 30d supply, fill #2
  Filled 2023-05-12 – 2023-05-18 (×4): qty 30, 30d supply, fill #3

## 2023-01-19 ENCOUNTER — Other Ambulatory Visit (HOSPITAL_COMMUNITY): Payer: Self-pay

## 2023-01-22 ENCOUNTER — Other Ambulatory Visit: Payer: Self-pay

## 2023-01-28 ENCOUNTER — Other Ambulatory Visit (HOSPITAL_BASED_OUTPATIENT_CLINIC_OR_DEPARTMENT_OTHER): Payer: Self-pay

## 2023-01-28 ENCOUNTER — Other Ambulatory Visit (HOSPITAL_COMMUNITY): Payer: Self-pay

## 2023-01-29 ENCOUNTER — Other Ambulatory Visit: Payer: Self-pay

## 2023-01-30 ENCOUNTER — Other Ambulatory Visit: Payer: Self-pay

## 2023-01-30 ENCOUNTER — Other Ambulatory Visit (HOSPITAL_COMMUNITY): Payer: Self-pay

## 2023-01-31 ENCOUNTER — Other Ambulatory Visit (HOSPITAL_COMMUNITY): Payer: Self-pay

## 2023-02-02 ENCOUNTER — Other Ambulatory Visit: Payer: Self-pay

## 2023-02-02 ENCOUNTER — Other Ambulatory Visit (HOSPITAL_COMMUNITY): Payer: Self-pay

## 2023-02-03 ENCOUNTER — Other Ambulatory Visit: Payer: Self-pay

## 2023-02-03 ENCOUNTER — Other Ambulatory Visit (HOSPITAL_COMMUNITY): Payer: Self-pay

## 2023-02-09 ENCOUNTER — Other Ambulatory Visit (HOSPITAL_COMMUNITY): Payer: Self-pay

## 2023-02-09 ENCOUNTER — Other Ambulatory Visit: Payer: Self-pay

## 2023-02-23 ENCOUNTER — Other Ambulatory Visit: Payer: Self-pay

## 2023-02-24 ENCOUNTER — Other Ambulatory Visit: Payer: Self-pay

## 2023-02-24 ENCOUNTER — Other Ambulatory Visit (HOSPITAL_COMMUNITY): Payer: Self-pay

## 2023-02-25 ENCOUNTER — Other Ambulatory Visit: Payer: Self-pay

## 2023-02-27 DIAGNOSIS — Z23 Encounter for immunization: Secondary | ICD-10-CM | POA: Diagnosis not present

## 2023-03-02 ENCOUNTER — Other Ambulatory Visit (HOSPITAL_COMMUNITY): Payer: Self-pay

## 2023-03-03 ENCOUNTER — Other Ambulatory Visit (HOSPITAL_COMMUNITY): Payer: Self-pay

## 2023-03-12 ENCOUNTER — Other Ambulatory Visit: Payer: Self-pay

## 2023-03-12 ENCOUNTER — Other Ambulatory Visit (HOSPITAL_COMMUNITY): Payer: Self-pay

## 2023-03-19 ENCOUNTER — Other Ambulatory Visit: Payer: Self-pay

## 2023-03-19 ENCOUNTER — Other Ambulatory Visit (HOSPITAL_COMMUNITY): Payer: Self-pay

## 2023-03-19 MED ORDER — LISINOPRIL 40 MG PO TABS
40.0000 mg | ORAL_TABLET | Freq: Every morning | ORAL | 0 refills | Status: DC
  Filled 2023-03-19: qty 90, 90d supply, fill #0
  Filled 2023-03-20: qty 30, 30d supply, fill #0
  Filled 2023-04-16: qty 30, 30d supply, fill #1
  Filled 2023-05-12 – 2023-05-18 (×3): qty 30, 30d supply, fill #2

## 2023-03-20 ENCOUNTER — Other Ambulatory Visit (HOSPITAL_COMMUNITY): Payer: Self-pay

## 2023-03-20 ENCOUNTER — Other Ambulatory Visit: Payer: Self-pay

## 2023-03-21 ENCOUNTER — Other Ambulatory Visit (HOSPITAL_COMMUNITY): Payer: Self-pay

## 2023-03-23 ENCOUNTER — Other Ambulatory Visit (HOSPITAL_COMMUNITY): Payer: Self-pay

## 2023-03-24 ENCOUNTER — Other Ambulatory Visit: Payer: Self-pay

## 2023-03-24 ENCOUNTER — Other Ambulatory Visit (HOSPITAL_COMMUNITY): Payer: Self-pay

## 2023-03-31 ENCOUNTER — Ambulatory Visit
Admission: EM | Admit: 2023-03-31 | Discharge: 2023-03-31 | Disposition: A | Discharge status: Other Acute Inpatient Hospital | Payer: PPO

## 2023-03-31 ENCOUNTER — Emergency Department (HOSPITAL_COMMUNITY)
Admission: EM | Admit: 2023-03-31 | Discharge: 2023-04-01 | Disposition: A | Discharge status: Home / Self Care | Payer: PPO | Attending: Emergency Medicine | Admitting: Emergency Medicine

## 2023-03-31 ENCOUNTER — Emergency Department (HOSPITAL_COMMUNITY): Payer: PPO

## 2023-03-31 ENCOUNTER — Other Ambulatory Visit: Payer: Self-pay

## 2023-03-31 DIAGNOSIS — Z91040 Latex allergy status: Secondary | ICD-10-CM | POA: Insufficient documentation

## 2023-03-31 DIAGNOSIS — S3992XA Unspecified injury of lower back, initial encounter: Secondary | ICD-10-CM | POA: Diagnosis present

## 2023-03-31 DIAGNOSIS — S32019A Unspecified fracture of first lumbar vertebra, initial encounter for closed fracture: Secondary | ICD-10-CM | POA: Insufficient documentation

## 2023-03-31 DIAGNOSIS — S32009A Unspecified fracture of unspecified lumbar vertebra, initial encounter for closed fracture: Secondary | ICD-10-CM

## 2023-03-31 DIAGNOSIS — M25552 Pain in left hip: Secondary | ICD-10-CM

## 2023-03-31 DIAGNOSIS — F0390 Unspecified dementia without behavioral disturbance: Secondary | ICD-10-CM | POA: Insufficient documentation

## 2023-03-31 DIAGNOSIS — S0990XA Unspecified injury of head, initial encounter: Secondary | ICD-10-CM | POA: Insufficient documentation

## 2023-03-31 DIAGNOSIS — W19XXXA Unspecified fall, initial encounter: Secondary | ICD-10-CM | POA: Diagnosis not present

## 2023-03-31 DIAGNOSIS — Z043 Encounter for examination and observation following other accident: Secondary | ICD-10-CM | POA: Diagnosis not present

## 2023-03-31 DIAGNOSIS — R9082 White matter disease, unspecified: Secondary | ICD-10-CM | POA: Diagnosis not present

## 2023-03-31 NOTE — ED Triage Notes (Signed)
Here with Spouse. "We were getting ready to get up and go to bed, helped up with walker, next thing I knew she had fallen and was found in floor". "This was last night right at bedtime". Since then "very painful to get up causing left hip to really hurt, ? If she hit her head". No LOC known. No nausea. No vomiting. No EMS to home. No lacerations or abrasions known.   Note: "She is not normally alert or oriented she has some Dementia". Caretaker: Spouse.

## 2023-03-31 NOTE — ED Provider Notes (Signed)
Patient presented to urgent care for evaluation of fall that occurred last night.  She has had significant hip pain since that time.  It is unclear if she hit her head.  Fall was  not witnessed.  Husband is here with her today.  I recommended further evaluation in the emergency room given unknown head injury, disorientation at baseline.  Husband is agreeable to transport via POV to local ED.   Tomi Bamberger, PA-C 03/31/23 1705

## 2023-03-31 NOTE — ED Notes (Signed)
Patient is being discharged from the Urgent Care and sent to the Emergency Department via POV . Per RM, patient is in need of higher level of care due to fall/hip pain. Patient is aware and verbalizes understanding of plan of care.  Vitals:   03/31/23 1653  BP: (!) 153/71  Pulse: 65  Resp: 18  Temp: 97.9 F (36.6 C)  SpO2: 97%

## 2023-03-31 NOTE — ED Triage Notes (Signed)
Pt arrived with husband. Patient poor historian due to dementia.Sent from urgent care for CT scan. Patient fell yesterday after husband placed her in bed. Denies LOC. Hit back of head. Pt states she is not in pain at this time. Husband states pain is with movement. Patient does not take a blood thinner per husband

## 2023-03-31 NOTE — ED Provider Triage Note (Signed)
Emergency Medicine Provider Triage Evaluation Note  Nichole Foster , a 82 y.o. female  was evaluated in triage.  Pt complains of fall yesterday. Hit head and landed on her bottom. Ambulatory at baseline.  Review of Systems  Positive: fall Negative:  loc  Physical Exam  BP (!) 142/72 (BP Location: Left Arm)   Pulse 77   Temp 98.3 F (36.8 C) (Oral)   Resp 16   SpO2 100%  Gen:   Awake, no distress   Resp:  Normal effort  MSK:   Moves extremities without difficulty  Other:    Medical Decision Making  Medically screening exam initiated at 7:24 PM.  Appropriate orders placed.  Nichole Foster was informed that the remainder of the evaluation will be completed by another provider, this initial triage assessment does not replace that evaluation, and the importance of remaining in the ED until their evaluation is complete.  No blood thinner   Nichole Captain, PA-C 03/31/23 1926

## 2023-04-01 ENCOUNTER — Emergency Department (HOSPITAL_COMMUNITY): Payer: PPO

## 2023-04-01 DIAGNOSIS — Z043 Encounter for examination and observation following other accident: Secondary | ICD-10-CM | POA: Diagnosis not present

## 2023-04-01 DIAGNOSIS — S32019A Unspecified fracture of first lumbar vertebra, initial encounter for closed fracture: Secondary | ICD-10-CM | POA: Diagnosis not present

## 2023-04-01 MED ORDER — ACETAMINOPHEN 325 MG PO TABS
650.0000 mg | ORAL_TABLET | Freq: Once | ORAL | Status: AC
Start: 2023-04-01 — End: 2023-04-01
  Administered 2023-04-01: 650 mg via ORAL
  Filled 2023-04-01: qty 2

## 2023-04-01 NOTE — ED Provider Notes (Signed)
[facility] AT [facility] Provider Note   CSN: [id number] Arrival date & time: 03/31/23  1738     History  Chief Complaint  Patient presents with   Fall   Back Pain    Nichole Foster is a 82 y.o. female.  82 year old female presents with her husband and is also accompanied by her daughter at bedside.  Presents due to a fall that occurred last night.  Went to urgent care and was referred to the emergency room.  This was an unwitnessed fall.  Husband states that he was attempting to get patient back to bed from wheelchair.  He states she was walking ahead of the patient and heard a thud.  She is not on anticoagulation.  Reports pain to her low back, tailbone.  She does have history of dementia.  Most of the history is provided by husband.  Denies loss of consciousness.  The history is provided by the patient. No language interpreter was used.       Home Medications Prior to Admission medications   Medication Sig Start Date End Date Taking? Authorizing Provider  alendronate (FOSAMAX) 70 MG tablet Take 70 mg by mouth every Friday. Take with a full glass of water on an empty stomach.    [provider]  amLODipine (NORVASC) 10 MG tablet Take 10 mg by mouth daily. 04/24/16   [provider]  amLODipine (NORVASC) 10 MG tablet Take 1 tablet (10 mg total) by mouth in the morning. 01/06/23     Calcium Carb-Cholecalciferol 600-20 MG-MCG CHEW Chew 1 tablet by mouth daily.    [provider]  CRANBERRY PO Take 500 mg by mouth daily.    [provider]  diclofenac Sodium (VOLTAREN) 1 % GEL Apply 2 g topically 4 (four) times daily. 11/01/21   Merrilee Jansky, MD  dipyridamole-aspirin (AGGRENOX) 200-25 MG 12hr capsule Take 1 capsule by mouth 2 (two) times daily. 01/06/23     dipyridamole-aspirin (AGGRENOX) 200-25 MG per 12 hr capsule Take 1 capsule by mouth 2 (two) times daily.    [provider]  docusate sodium (COLACE)  100 MG capsule Take 100 mg by mouth 2 (two) times daily.    [provider]  ESTRACE VAGINAL 0.1 MG/GM vaginal cream Place 2 g vaginally daily as needed (dryness). 02/02/13   [provider]  fluticasone (FLONASE) 50 MCG/ACT nasal spray Place 2 sprays into the nose daily.    [provider]  hydrocortisone (ANUSOL-HC) 2.5 % rectal cream Place 1 application rectally 2 (two) times daily. Patient taking differently: Place 1 application  rectally daily as needed for hemorrhoids or anal itching. 10/22/20   Caccavale, Sophia, PA-C  IRON PO Take 45 mg by mouth every evening.    [provider]  lamoTRIgine (LAMICTAL) 25 MG tablet Take 50 mg by mouth daily before breakfast. 05/09/16   [provider]  lamoTRIgine (LAMICTAL) 25 MG tablet Take 2 tablets (50 mg total) by mouth daily with breakfast. 01/06/23     levocetirizine (XYZAL) 5 MG tablet Take 5 mg by mouth every evening.    [provider]  levothyroxine (SYNTHROID) 50 MCG tablet Take 1 tablet (50 mcg total) by mouth daily before breakfast. 01/06/23     levothyroxine (SYNTHROID, LEVOTHROID) 50 MCG tablet Take 50 mcg by mouth daily before breakfast.    [provider]  lisinopril (PRINIVIL,ZESTRIL) 20 MG tablet Take 1 tablet (20 mg total) by mouth daily. Patient taking differently:  Take 40 mg by mouth every morning. 02/13/13   Clydia Llano, MD  lisinopril (ZESTRIL) 40 MG tablet Take 1 tablet (40 mg total) by mouth in the morning. 03/19/23     magnesium oxide (MAG-OX) 400 (240 Mg) MG tablet Take 1 tablet (400 mg total) by mouth daily. 03/19/21   Romie Levee, MD  Multiple Vitamin (MULTIVITAMIN WITH MINERALS) TABS tablet Take 1 tablet by mouth daily. 50 plus    [provider]  Naphazoline-Pheniramine (OPCON-A) 0.027-0.315 % SOLN Place 1 drop into both eyes daily as needed (Dry eyes).    [provider]  Omega-3 Fatty Acids (FISH OIL) 500 MG CAPS Take 500 mg by mouth daily.     [provider]  polyethylene glycol (MIRALAX / GLYCOLAX) packet Take 17 g by mouth daily as needed. Patient taking differently: Take 17 g by mouth daily. 02/28/13   Kathlen Mody, MD  QUEtiapine (SEROQUEL) 100 MG tablet Take 100 mg by mouth at bedtime. 04/22/16   [provider]  QUEtiapine (SEROQUEL) 100 MG tablet Take 0.5 tablets (50 mg total) by mouth at bedtime. 01/06/23     simvastatin (ZOCOR) 20 MG tablet Take 20 mg by mouth every evening. 10/11/20   [provider]  simvastatin (ZOCOR) 20 MG tablet Take 1 tablet (20 mg total) by mouth every evening. 01/08/23     traMADol (ULTRAM) 50 MG tablet Take 1 tablet (50 mg total) by mouth every 6 (six) hours as needed for moderate pain. 03/19/21   Romie Levee, MD      Allergies    Sulfa antibiotics and Latex    Review of Systems   Review of Systems  Gastrointestinal:  Negative for abdominal pain.  Genitourinary:  Negative for dysuria.  Musculoskeletal:  Positive for arthralgias and back pain.  Neurological:  Negative for syncope.  All other systems reviewed and are negative.   Physical Exam Updated Vital Signs BP (!) 161/62   Pulse 63   Temp 98.4 F (36.9 C) (Oral)   Resp 16   SpO2 100%  Physical Exam Vitals and nursing note reviewed.  Constitutional:      General: She is not in acute distress.    Appearance: Normal appearance. She is not ill-appearing.  HENT:     Head: Normocephalic and atraumatic.     Nose: Nose normal.  Eyes:     Conjunctiva/sclera: Conjunctivae normal.  Cardiovascular:     Rate and Rhythm: Normal rate and regular rhythm.  Pulmonary:     Effort: Pulmonary effort is normal. No respiratory distress.  Abdominal:     General: There is no distension.     Tenderness: There is no abdominal tenderness. There is no guarding.  Musculoskeletal:        General: No deformity. Normal range of motion.     Cervical back: Normal range of motion.     Comments: Cervical, thoracic spine  without tenderness palpation.  Tenderness to palpation over the lumbar spine as well as lumbar paraspinal muscles.  Pain over the hips.  However good range of motion bilateral hips, knees, and ankles.  Skin:    Findings: No rash.  Neurological:     Mental Status: She is alert.     ED Results / Procedures / Treatments   Labs (all labs ordered are listed, but only abnormal results are displayed) Labs Reviewed - No data to display  EKG None  Radiology CT Lumbar Spine Wo Contrast  Result Date: 04/01/2023 CLINICAL DATA:  Low back  pain and trauma EXAM: CT LUMBAR SPINE WITHOUT CONTRAST TECHNIQUE: Multidetector CT imaging of the lumbar spine was performed without intravenous contrast administration. Multiplanar CT image reconstructions were also generated. RADIATION DOSE REDUCTION: This exam was performed according to the departmental dose-optimization program which includes automated exposure control, adjustment of the mA and/or kV according to patient size and/or use of iterative reconstruction technique. COMPARISON:  None Available. FINDINGS: Segmentation: 5 lumbar type vertebrae. Alignment: Normal. Vertebrae: Nondisplaced fracture of the right L1 transverse process. No other fracture. Paraspinal and other soft tissues: Negative. Disc levels: No spinal canal stenosis. IMPRESSION: Nondisplaced fracture of the right L1 transverse process. Electronically Signed   By: Deatra Robinson M.D.   On: 04/01/2023 00:16   DG Sacrum/Coccyx  Result Date: 03/31/2023 CLINICAL DATA:  Fall. EXAM: SACRUM AND COCCYX - 2+ VIEW COMPARISON:  March 26, 2013. FINDINGS: There is no evidence of fracture or other focal bone lesions. IMPRESSION: Negative. Electronically Signed   By: Lupita Raider M.D.   On: 03/31/2023 20:47   CT Head Wo Contrast  Result Date: 03/31/2023 CLINICAL DATA:  Fall EXAM: CT HEAD WITHOUT CONTRAST CT CERVICAL SPINE WITHOUT CONTRAST TECHNIQUE: Multidetector CT imaging of the head and cervical spine  was performed following the standard protocol without intravenous contrast. Multiplanar CT image reconstructions of the cervical spine were also generated. RADIATION DOSE REDUCTION: This exam was performed according to the departmental dose-optimization program which includes automated exposure control, adjustment of the mA and/or kV according to patient size and/or use of iterative reconstruction technique. COMPARISON:  02/18/2013 FINDINGS: CT HEAD FINDINGS Brain: There is no mass, hemorrhage or extra-axial collection. The size and configuration of the ventricles and extra-axial CSF spaces are normal. There is hypoattenuation of the periventricular white matter, most commonly indicating chronic ischemic microangiopathy. Multiple old right cerebellar small vessel infarcts. Vascular: No abnormal hyperdensity of the major intracranial arteries or dural venous sinuses. No intracranial atherosclerosis. Skull: The visualized skull base, calvarium and extracranial soft tissues are normal. Sinuses/Orbits: No fluid levels or advanced mucosal thickening of the visualized paranasal sinuses. No mastoid or middle ear effusion. The orbits are normal. CT CERVICAL SPINE FINDINGS Alignment: No static subluxation. Facets are aligned. Occipital condyles are normally positioned. Skull base and vertebrae: No acute fracture. Soft tissues and spinal canal: No prevertebral fluid or swelling. No visible canal hematoma. Disc levels: Central disc protrusion at C3-4 indenting the ventral spinal cord. Upper chest: No pneumothorax, pulmonary nodule or pleural effusion. Other: Normal visualized paraspinal cervical soft tissues. IMPRESSION: 1. No acute intracranial abnormality. 2. Chronic ischemic microangiopathy and multiple old right cerebellar small vessel infarcts. 3. No acute fracture or static subluxation of the cervical spine. 4. Central disc protrusion at C3-4 indenting the ventral spinal cord. Electronically Signed   By: Deatra Robinson  M.D.   On: 03/31/2023 20:22   CT Cervical Spine Wo Contrast  Result Date: 03/31/2023 CLINICAL DATA:  Fall EXAM: CT HEAD WITHOUT CONTRAST CT CERVICAL SPINE WITHOUT CONTRAST TECHNIQUE: Multidetector CT imaging of the head and cervical spine was performed following the standard protocol without intravenous contrast. Multiplanar CT image reconstructions of the cervical spine were also generated. RADIATION DOSE REDUCTION: This exam was performed according to the departmental dose-optimization program which includes automated exposure control, adjustment of the mA and/or kV according to patient size and/or use of iterative reconstruction technique. COMPARISON:  02/18/2013 FINDINGS: CT HEAD FINDINGS Brain: There is no mass, hemorrhage or extra-axial collection. The size and configuration of the ventricles and extra-axial  CSF spaces are normal. There is hypoattenuation of the periventricular white matter, most commonly indicating chronic ischemic microangiopathy. Multiple old right cerebellar small vessel infarcts. Vascular: No abnormal hyperdensity of the major intracranial arteries or dural venous sinuses. No intracranial atherosclerosis. Skull: The visualized skull base, calvarium and extracranial soft tissues are normal. Sinuses/Orbits: No fluid levels or advanced mucosal thickening of the visualized paranasal sinuses. No mastoid or middle ear effusion. The orbits are normal. CT CERVICAL SPINE FINDINGS Alignment: No static subluxation. Facets are aligned. Occipital condyles are normally positioned. Skull base and vertebrae: No acute fracture. Soft tissues and spinal canal: No prevertebral fluid or swelling. No visible canal hematoma. Disc levels: Central disc protrusion at C3-4 indenting the ventral spinal cord. Upper chest: No pneumothorax, pulmonary nodule or pleural effusion. Other: Normal visualized paraspinal cervical soft tissues. IMPRESSION: 1. No acute intracranial abnormality. 2. Chronic ischemic  microangiopathy and multiple old right cerebellar small vessel infarcts. 3. No acute fracture or static subluxation of the cervical spine. 4. Central disc protrusion at C3-4 indenting the ventral spinal cord. Electronically Signed   By: Deatra Robinson M.D.   On: 03/31/2023 20:22    Procedures Procedures    Medications Ordered in ED Medications  acetaminophen (TYLENOL) tablet 650 mg (has no administration in time range)    ED Course/ Medical Decision Making/ A&P                                 Medical Decision Making Amount and/or Complexity of Data Reviewed Radiology: ordered.  Risk OTC drugs.   82 year old female presents with her husband and daughter.  Concern for a fall that occurred last night.  Through triage patient has CT head, cervical spine, and sacrum x-ray obtained.  These studies were without acute findings.  Given her tenderness over the lumbar spine will add on lumbar CT, as well as pelvic x-ray.  Patient and family agreeable.  Will provide Tylenol for pain control.  Lumbar spine CT shows L1 transverse process fracture that is nondisplaced.  Discussed TLSO brace however patient's husband states that he has 1 at home that patient occasionally uses and will use as needed.  They did not want to wait for a TLSO brace in the emergency department.  Feel this is reasonable.  He believes she will be compliant with this.  Neurosurgery referral given.  She refused a Tylenol here because she was not in significant pain.  They will continue to take Tylenol as needed.  Return precaution discussed.  Patient and family voiced understanding and are in agreement with plan.  Final Clinical Impression(s) / ED Diagnoses Final diagnoses:  Fall, initial encounter  Closed fracture of transverse process of lumbar vertebra, initial encounter [facility])    Rx / DC Orders ED Discharge Orders     None         Marita Kansas, PA-C 04/01/23 0057    Terald Sleeper, MD 04/01/23 [phone]

## 2023-04-01 NOTE — Discharge Instructions (Addendum)
The scan of the head and neck did not show any concerning findings.  X-rays of the tailbone and pelvis were without fracture.  CT scan of the lumbar spine showed a nondisplaced fracture of the L1 transverse process.  We discussed a brace that she could wear however he stated that she have one at home that she can wear.  I have listed a neurosurgeon to follow-up with.  Take Tylenol as needed for pain control.  Return to the emergency room for any concerning symptoms.

## 2023-04-14 ENCOUNTER — Other Ambulatory Visit (HOSPITAL_COMMUNITY): Payer: Self-pay

## 2023-04-14 ENCOUNTER — Other Ambulatory Visit (HOSPITAL_BASED_OUTPATIENT_CLINIC_OR_DEPARTMENT_OTHER): Payer: Self-pay

## 2023-04-14 ENCOUNTER — Other Ambulatory Visit: Payer: Self-pay

## 2023-04-15 ENCOUNTER — Other Ambulatory Visit (HOSPITAL_COMMUNITY): Payer: Self-pay

## 2023-04-15 ENCOUNTER — Other Ambulatory Visit: Payer: Self-pay

## 2023-04-15 MED ORDER — AMLODIPINE BESYLATE 10 MG PO TABS
10.0000 mg | ORAL_TABLET | ORAL | 0 refills | Status: DC
  Filled 2023-04-15 – 2023-04-16 (×2): qty 30, 30d supply, fill #0

## 2023-04-15 MED ORDER — ASPIRIN-DIPYRIDAMOLE ER 25-200 MG PO CP12
1.0000 | ORAL_CAPSULE | Freq: Two times a day (BID) | ORAL | 0 refills | Status: DC
  Filled 2023-04-15 – 2023-04-16 (×2): qty 60, 30d supply, fill #0

## 2023-04-15 MED ORDER — LAMOTRIGINE 25 MG PO TABS
50.0000 mg | ORAL_TABLET | Freq: Every day | ORAL | 0 refills | Status: DC
  Filled 2023-04-15 – 2023-04-16 (×2): qty 60, 30d supply, fill #0

## 2023-04-15 MED ORDER — QUETIAPINE FUMARATE 50 MG PO TABS
50.0000 mg | ORAL_TABLET | Freq: Every day | ORAL | 0 refills | Status: AC
  Filled 2023-04-15: qty 90, 90d supply, fill #0
  Filled 2023-04-16: qty 30, 30d supply, fill #0
  Filled 2023-05-12 – 2023-05-18 (×3): qty 30, 30d supply, fill #1
  Filled 2023-06-01: qty 30, 30d supply, fill #2

## 2023-04-16 ENCOUNTER — Other Ambulatory Visit (HOSPITAL_COMMUNITY): Payer: Self-pay

## 2023-04-16 ENCOUNTER — Other Ambulatory Visit: Payer: Self-pay

## 2023-05-01 ENCOUNTER — Other Ambulatory Visit: Payer: Self-pay

## 2023-05-05 ENCOUNTER — Other Ambulatory Visit (HOSPITAL_COMMUNITY): Payer: Self-pay

## 2023-05-05 MED ORDER — AMLODIPINE BESYLATE 10 MG PO TABS: 10.0000 mg | ORAL_TABLET | Freq: Every morning | ORAL | 0 refills | Status: AC

## 2023-05-05 MED ORDER — LAMOTRIGINE 25 MG PO TABS
50.0000 mg | ORAL_TABLET | Freq: Every day | ORAL | 0 refills | Status: DC
  Filled 2023-08-07 – 2023-11-03 (×3): qty 60, 30d supply, fill #0

## 2023-05-05 MED ORDER — ASPIRIN-DIPYRIDAMOLE ER 25-200 MG PO CP12
1.0000 | ORAL_CAPSULE | Freq: Two times a day (BID) | ORAL | 0 refills | Status: DC
  Filled 2023-05-18: qty 60, 30d supply, fill #0

## 2023-05-06 ENCOUNTER — Other Ambulatory Visit: Payer: Self-pay

## 2023-05-08 ENCOUNTER — Other Ambulatory Visit: Payer: Self-pay

## 2023-05-12 ENCOUNTER — Other Ambulatory Visit: Payer: Self-pay

## 2023-05-15 ENCOUNTER — Other Ambulatory Visit: Payer: Self-pay

## 2023-05-18 ENCOUNTER — Other Ambulatory Visit: Payer: Self-pay

## 2023-05-18 ENCOUNTER — Other Ambulatory Visit (HOSPITAL_COMMUNITY): Payer: Self-pay

## 2023-05-19 ENCOUNTER — Other Ambulatory Visit: Payer: Self-pay

## 2023-05-19 ENCOUNTER — Other Ambulatory Visit (HOSPITAL_COMMUNITY): Payer: Self-pay

## 2023-05-22 DIAGNOSIS — I129 Hypertensive chronic kidney disease with stage 1 through stage 4 chronic kidney disease, or unspecified chronic kidney disease: Secondary | ICD-10-CM | POA: Diagnosis not present

## 2023-05-22 DIAGNOSIS — E039 Hypothyroidism, unspecified: Secondary | ICD-10-CM | POA: Diagnosis not present

## 2023-05-22 DIAGNOSIS — E785 Hyperlipidemia, unspecified: Secondary | ICD-10-CM | POA: Diagnosis not present

## 2023-05-22 DIAGNOSIS — N1831 Chronic kidney disease, stage 3a: Secondary | ICD-10-CM | POA: Diagnosis not present

## 2023-05-24 ENCOUNTER — Other Ambulatory Visit (HOSPITAL_COMMUNITY): Payer: Self-pay

## 2023-05-24 MED ORDER — SIMVASTATIN 20 MG PO TABS
20.0000 mg | ORAL_TABLET | Freq: Every evening | ORAL | 11 refills | Status: AC
  Filled 2023-06-01 – 2023-06-17 (×4): qty 30, 30d supply, fill #0
  Filled 2023-07-02 – 2023-07-14 (×2): qty 30, 30d supply, fill #1
  Filled 2023-08-07 – 2023-08-11 (×3): qty 30, 30d supply, fill #2
  Filled 2023-08-20 – 2023-09-03 (×2): qty 30, 30d supply, fill #3
  Filled 2023-09-23 – 2023-10-06 (×2): qty 30, 30d supply, fill #4
  Filled 2023-10-26 – 2023-11-03 (×2): qty 30, 30d supply, fill #5
  Filled 2023-12-03: qty 30, 30d supply, fill #6
  Filled 2023-12-30 – 2024-01-05 (×3): qty 30, 30d supply, fill #7
  Filled 2024-02-02: qty 30, 30d supply, fill #8
  Filled 2024-03-08: qty 30, 30d supply, fill #9
  Filled 2024-04-05: qty 30, 30d supply, fill #10
  Filled 2024-05-06: qty 30, 30d supply, fill #11

## 2023-05-24 MED ORDER — LAMOTRIGINE 25 MG PO TABS
50.0000 mg | ORAL_TABLET | Freq: Every day | ORAL | 5 refills | Status: AC
  Filled 2023-05-24: qty 60, 30d supply, fill #0
  Filled 2023-06-01 – 2023-06-17 (×4): qty 60, 30d supply, fill #1
  Filled 2023-07-02 – 2023-07-14 (×2): qty 60, 30d supply, fill #2
  Filled 2023-08-07 – 2023-08-11 (×3): qty 60, 30d supply, fill #3
  Filled 2023-08-20 – 2023-09-03 (×2): qty 60, 30d supply, fill #4
  Filled 2023-09-23 – 2023-10-06 (×2): qty 60, 30d supply, fill #5

## 2023-05-24 MED ORDER — AMLODIPINE BESYLATE 10 MG PO TABS
10.0000 mg | ORAL_TABLET | Freq: Every morning | ORAL | 11 refills | Status: AC
  Filled 2023-05-24 – 2023-05-25 (×2): qty 30, 30d supply, fill #0
  Filled 2023-06-01 – 2023-06-17 (×4): qty 30, 30d supply, fill #1
  Filled 2023-07-02 – 2023-07-14 (×2): qty 30, 30d supply, fill #2
  Filled 2023-08-07 – 2023-08-11 (×3): qty 30, 30d supply, fill #3
  Filled 2023-08-20 – 2023-09-03 (×2): qty 30, 30d supply, fill #4
  Filled 2023-09-23 – 2023-10-06 (×2): qty 30, 30d supply, fill #5
  Filled 2023-10-26 – 2023-11-03 (×2): qty 30, 30d supply, fill #6
  Filled 2023-12-03: qty 30, 30d supply, fill #7
  Filled 2023-12-30 – 2024-01-05 (×3): qty 30, 30d supply, fill #8
  Filled 2024-02-02: qty 30, 30d supply, fill #9
  Filled 2024-03-08: qty 30, 30d supply, fill #10
  Filled 2024-04-05: qty 30, 30d supply, fill #11

## 2023-05-24 MED ORDER — LISINOPRIL 40 MG PO TABS
40.0000 mg | ORAL_TABLET | Freq: Every morning | ORAL | 11 refills | Status: AC
  Filled 2023-06-15 – 2023-06-17 (×3): qty 30, 30d supply, fill #0
  Filled 2023-07-02 – 2023-07-14 (×2): qty 30, 30d supply, fill #1
  Filled 2023-08-07 – 2023-08-11 (×3): qty 30, 30d supply, fill #2
  Filled 2023-08-20 – 2023-09-03 (×2): qty 30, 30d supply, fill #3
  Filled 2023-09-23 – 2023-10-06 (×2): qty 30, 30d supply, fill #4
  Filled 2023-10-26 – 2023-11-03 (×2): qty 30, 30d supply, fill #5
  Filled 2023-12-03: qty 30, 30d supply, fill #6
  Filled 2023-12-30 – 2024-01-05 (×3): qty 30, 30d supply, fill #7
  Filled 2024-02-02: qty 30, 30d supply, fill #8
  Filled 2024-03-08: qty 30, 30d supply, fill #9
  Filled 2024-04-05: qty 30, 30d supply, fill #10
  Filled 2024-05-06: qty 30, 30d supply, fill #11

## 2023-05-24 MED ORDER — QUETIAPINE FUMARATE 50 MG PO TABS
50.0000 mg | ORAL_TABLET | Freq: Every day | ORAL | 11 refills | Status: AC
  Filled 2023-06-15 – 2023-06-17 (×3): qty 30, 30d supply, fill #0
  Filled 2023-07-02 – 2023-07-14 (×2): qty 30, 30d supply, fill #1
  Filled 2023-08-07 – 2023-08-11 (×3): qty 30, 30d supply, fill #2
  Filled 2023-08-20 – 2023-09-03 (×2): qty 30, 30d supply, fill #3
  Filled 2023-09-23 – 2023-10-06 (×2): qty 30, 30d supply, fill #4
  Filled 2023-10-26 – 2023-11-03 (×2): qty 30, 30d supply, fill #5
  Filled 2023-12-03: qty 30, 30d supply, fill #6
  Filled 2023-12-30 – 2024-01-05 (×3): qty 30, 30d supply, fill #7
  Filled 2024-02-02: qty 30, 30d supply, fill #8
  Filled 2024-03-08: qty 30, 30d supply, fill #9
  Filled 2024-04-05: qty 30, 30d supply, fill #10
  Filled 2024-05-06: qty 30, 30d supply, fill #11

## 2023-05-25 ENCOUNTER — Other Ambulatory Visit (HOSPITAL_COMMUNITY): Payer: Self-pay

## 2023-05-25 ENCOUNTER — Other Ambulatory Visit: Payer: Self-pay

## 2023-05-25 MED ORDER — ASPIRIN-DIPYRIDAMOLE ER 25-200 MG PO CP12
1.0000 | ORAL_CAPSULE | Freq: Two times a day (BID) | ORAL | 11 refills | Status: AC
  Filled 2023-05-25 – 2023-06-17 (×5): qty 60, 30d supply, fill #0
  Filled 2023-07-02 – 2023-07-14 (×2): qty 60, 30d supply, fill #1
  Filled 2023-08-07 – 2023-08-11 (×3): qty 60, 30d supply, fill #2
  Filled 2023-08-20 – 2023-09-03 (×2): qty 60, 30d supply, fill #3
  Filled 2023-09-23 – 2023-10-06 (×3): qty 60, 30d supply, fill #4
  Filled 2023-10-26 – 2023-11-03 (×2): qty 60, 30d supply, fill #5
  Filled 2023-12-03 – 2023-12-04 (×2): qty 60, 30d supply, fill #6
  Filled 2023-12-30 – 2024-01-06 (×3): qty 60, 30d supply, fill #7
  Filled 2024-02-02 (×2): qty 60, 30d supply, fill #8
  Filled 2024-03-01 – 2024-03-02 (×2): qty 60, 30d supply, fill #9
  Filled 2024-03-29 – 2024-03-30 (×2): qty 60, 30d supply, fill #10
  Filled 2024-05-11 – 2024-05-12 (×2): qty 60, 30d supply, fill #11

## 2023-06-01 ENCOUNTER — Other Ambulatory Visit: Payer: Self-pay

## 2023-06-03 ENCOUNTER — Other Ambulatory Visit (HOSPITAL_COMMUNITY): Payer: Self-pay

## 2023-06-03 MED ORDER — LEVOTHYROXINE SODIUM 50 MCG PO TABS
50.0000 ug | ORAL_TABLET | Freq: Every day | ORAL | 1 refills | Status: DC
  Filled 2023-06-15 – 2023-06-17 (×2): qty 90, 90d supply, fill #0
  Filled 2023-06-17: qty 30, 30d supply, fill #0
  Filled 2023-07-02 – 2023-07-14 (×2): qty 30, 30d supply, fill #1
  Filled 2023-08-07 – 2023-08-11 (×3): qty 30, 30d supply, fill #2
  Filled 2023-08-20 – 2023-09-03 (×2): qty 30, 30d supply, fill #3
  Filled 2023-09-23 – 2023-10-06 (×2): qty 30, 30d supply, fill #4
  Filled 2023-10-26 – 2023-11-03 (×2): qty 30, 30d supply, fill #5

## 2023-06-04 ENCOUNTER — Other Ambulatory Visit (HOSPITAL_COMMUNITY): Payer: Self-pay

## 2023-06-11 ENCOUNTER — Other Ambulatory Visit: Payer: Self-pay

## 2023-06-15 ENCOUNTER — Other Ambulatory Visit: Payer: Self-pay

## 2023-06-17 ENCOUNTER — Other Ambulatory Visit (HOSPITAL_COMMUNITY): Payer: Self-pay

## 2023-06-17 ENCOUNTER — Other Ambulatory Visit: Payer: Self-pay

## 2023-06-18 ENCOUNTER — Other Ambulatory Visit: Payer: Self-pay

## 2023-07-02 ENCOUNTER — Other Ambulatory Visit: Payer: Self-pay

## 2023-07-14 ENCOUNTER — Other Ambulatory Visit (HOSPITAL_COMMUNITY): Payer: Self-pay

## 2023-07-14 ENCOUNTER — Other Ambulatory Visit: Payer: Self-pay

## 2023-07-15 ENCOUNTER — Other Ambulatory Visit: Payer: Self-pay

## 2023-08-07 ENCOUNTER — Other Ambulatory Visit: Payer: Self-pay

## 2023-08-10 ENCOUNTER — Other Ambulatory Visit: Payer: Self-pay

## 2023-08-11 ENCOUNTER — Other Ambulatory Visit: Payer: Self-pay

## 2023-08-20 ENCOUNTER — Other Ambulatory Visit: Payer: Self-pay

## 2023-09-03 ENCOUNTER — Other Ambulatory Visit: Payer: Self-pay

## 2023-09-04 ENCOUNTER — Other Ambulatory Visit: Payer: Self-pay

## 2023-09-04 ENCOUNTER — Ambulatory Visit: Admission: EM | Admit: 2023-09-04 | Discharge: 2023-09-04 | Disposition: A | Discharge status: Home / Self Care

## 2023-09-04 ENCOUNTER — Encounter: Payer: Self-pay | Admitting: *Deleted

## 2023-09-04 DIAGNOSIS — R222 Localized swelling, mass and lump, trunk: Secondary | ICD-10-CM | POA: Diagnosis not present

## 2023-09-04 NOTE — ED Provider Notes (Signed)
 [facility]    CSN: [id number] Arrival date & time: 09/04/23  1510      History   Chief Complaint Chief Complaint  Patient presents with   Mass    HPI Nichole Foster is a 83 y.o. female.   Nichole Foster is a 83 y.o. female with history of dementia and stroke presenting with her husband who contributes to the history for chief complaint of Mass to the back that was first noticed by her daughter approximately 1 month ago and her husband noticed it 2 days ago. Mass is located to the right upper back to the lateral scapula.  Lesion is tender to palpation.  Denies redness, warmth, and drainage from the lesion.  No changes in size and lesion over the last 2 days since the husband noticed it.  She has never had lipomas in the past.  Denies history of abscesses.  No recent trauma or injuries to the ribs.  Denies recent falls to injure this area.  She has a primary care provider and has not yet discussed this with them.     Past Medical History:  Diagnosis Date   Anemia    Cancer (HCC)    Depression    Heart murmur    High cholesterol    Hypertension    Osteoporosis    Panic attack    Stroke [facility])    Thyroid  disease     Patient Active Problem List   Diagnosis Date Noted   Rectal prolapse 03/15/2021   Osteoporosis 01/21/2021   Panic attack 01/21/2021   Thyroid  disease 01/21/2021   Hypertension 01/21/2021   High cholesterol 01/21/2021   Hyponatremia 02/12/2013   Acute encephalopathy 02/12/2013    Past Surgical History:  Procedure Laterality Date   CATARACT EXTRACTION     XI ROBOT ASSISTED RECTOPEXY N/A 03/15/2021   Procedure: XI ROBOT ASSISTED RECTOPEXY;  Surgeon: Kemisha Nixon, MD;  Location: [facility];  Service: General;  Laterality: N/A;    OB History   No obstetric history on file.      Home Medications    Prior to Admission medications   Medication Sig Start Date End Date Taking? Authorizing Provider  amLODipine  (NORVASC ) 10 MG tablet Take 1  tablet (10 mg total) by mouth in the morning. 05/23/23  Yes   Calcium  Carb-Cholecalciferol  600-20 MG-MCG CHEW Chew 1 tablet by mouth daily.   Yes [provider]  CRANBERRY PO Take 500 mg by mouth daily.   Yes [provider]  dipyridamole -aspirin  (AGGRENOX ) 200-25 MG 12hr capsule Take 1 capsule by mouth twice daily 05/23/23  Yes   docusate sodium  (COLACE) 100 MG capsule Take 100 mg by mouth 2 (two) times daily.   Yes [provider]  fluticasone  (FLONASE ) 50 MCG/ACT nasal spray Place 2 sprays into the nose daily.   Yes [provider]  hydrocortisone  (ANUSOL -HC) 2.5 % rectal cream Place 1 application rectally 2 (two) times daily. Patient taking differently: Place 1 application  rectally daily as needed for hemorrhoids or anal itching. 10/22/20  Yes Caccavale, Sophia, PA-C  IRON PO Take 45 mg by mouth every evening.   Yes [provider]  lamoTRIgine  (LAMICTAL ) 25 MG tablet Take 2 tablets (50 mg total) by mouth daily with breakfast. 05/23/23  Yes   levocetirizine (XYZAL) 5 MG tablet Take 5 mg by mouth every evening.   Yes [provider]  levothyroxine  (SYNTHROID ) 50 MCG tablet Take 1 tablet (50 mcg total) by mouth daily before  breakfast. 06/03/23  Yes   lisinopril  (ZESTRIL ) 40 MG tablet Take 1 tablet (40 mg total) by mouth in the morning. 05/23/23  Yes   Multiple Vitamin (MULTIVITAMIN WITH MINERALS) TABS tablet Take 1 tablet by mouth daily. 50 plus   Yes [provider]  Naphazoline-Pheniramine (OPCON-A ) 0.027-0.315 % SOLN Place 1 drop into both eyes daily as needed (Dry eyes).   Yes [provider]  Omega-3 Fatty Acids (FISH OIL) 500 MG CAPS Take 500 mg by mouth daily.   Yes [provider]  polyethylene glycol (MIRALAX  / GLYCOLAX ) packet Take 17 g by mouth daily as needed. Patient taking differently: Take 17 g by mouth daily. 02/28/13  Yes Akula, Vijaya, MD  QUEtiapine  (SEROQUEL ) 50 MG tablet Take 1 tablet (50 mg total) by  mouth at bedtime. 05/23/23  Yes   simvastatin  (ZOCOR ) 20 MG tablet Take 1 tablet (20 mg total) by mouth every evening. 05/23/23  Yes   alendronate  (FOSAMAX ) 70 MG tablet Take 70 mg by mouth every Friday. Take with a full glass of water on an empty stomach. Patient not taking: Reported on 09/04/2023    [provider]  amLODipine  (NORVASC ) 10 MG tablet Take 10 mg by mouth daily. 04/24/16   [provider]  amLODipine  (NORVASC ) 10 MG tablet Take 1 tablet (10 mg total) by mouth in the morning. 05/04/23     diclofenac  Sodium (VOLTAREN ) 1 % GEL Apply 2 g topically 4 (four) times daily. Patient not taking: Reported on 09/04/2023 11/01/21   Corine Dice, MD  dipyridamole -aspirin  (AGGRENOX ) 200-25 MG per 12 hr capsule Take 1 capsule by mouth 2 (two) times daily.    [provider]  ESTRACE VAGINAL 0.1 MG/GM vaginal cream Place 2 g vaginally daily as needed (dryness). Patient not taking: Reported on 09/04/2023 02/02/13   [provider]  lamoTRIgine  (LAMICTAL ) 25 MG tablet Take 50 mg by mouth daily before breakfast. 05/09/16   [provider]  lamoTRIgine  (LAMICTAL ) 25 MG tablet Take 2 tablets (50 mg total) by mouth daily with breakfast. 05/04/23     levothyroxine  (SYNTHROID , LEVOTHROID) 50 MCG tablet Take 50 mcg by mouth daily before breakfast.    [provider]  lisinopril  (PRINIVIL ,ZESTRIL ) 20 MG tablet Take 1 tablet (20 mg total) by mouth daily. Patient taking differently: Take 40 mg by mouth every morning. 02/13/13   Harden Leyden, MD  magnesium  oxide (MAG-OX) 400 (240 Mg) MG tablet Take 1 tablet (400 mg total) by mouth daily. Patient not taking: Reported on 09/04/2023 03/19/21   Eilleen Nixon, MD  QUEtiapine  (SEROQUEL ) 100 MG tablet Take 100 mg by mouth at bedtime. 04/22/16   [provider]  QUEtiapine  (SEROQUEL ) 100 MG tablet Take 0.5 tablets (50 mg total) by mouth at bedtime. 01/06/23     QUEtiapine  (SEROQUEL ) 50 MG tablet Take 1 tablet (50 mg  total) by mouth at bedtime. 04/15/23     simvastatin  (ZOCOR ) 20 MG tablet Take 20 mg by mouth every evening. 10/11/20   [provider]  traMADol  (ULTRAM ) 50 MG tablet Take 1 tablet (50 mg total) by mouth every 6 (six) hours as needed for moderate pain. Patient not taking: Reported on 09/04/2023 03/19/21   Fayette Nixon, MD    Family History History reviewed. No pertinent family history.  Social History Social History   Tobacco Use   Smoking status: Never   Smokeless tobacco: Never  Vaping Use   Vaping status: Never Used  Substance Use Topics   Alcohol  use: No   Drug use: No     Allergies   Codeine, Sulfa antibiotics, and Latex   Review of Systems Review of Systems Per HPI  Physical Exam Triage Vital Signs ED Triage Vitals [09/04/23 1656]  Encounter Vitals Group     BP      Systolic BP Percentile      Diastolic BP Percentile      Pulse      Resp      Temp      Temp src      SpO2      Weight      Height      Head Circumference      Peak Flow      Pain Score 0     Pain Loc      Pain Education      Exclude from Growth Chart    No data found.  Updated Vital Signs There were no vitals taken for this visit.  Visual Acuity Right Eye Distance:   Left Eye Distance:   Bilateral Distance:    Right Eye Near:   Left Eye Near:    Bilateral Near:     Physical Exam Vitals and nursing note reviewed.  Constitutional:      Appearance: She is not ill-appearing or toxic-appearing.  HENT:     Head: Normocephalic and atraumatic.     Right Ear: Hearing and external ear normal.     Left Ear: Hearing and external ear normal.     Nose: Nose normal.     Mouth/Throat:     Lips: Pink.  Eyes:     General: Lids are normal. Vision grossly intact. Gaze aligned appropriately.     Extraocular Movements: Extraocular movements intact.     Conjunctiva/sclera: Conjunctivae normal.  Pulmonary:     Effort: Pulmonary effort is normal.  Musculoskeletal:     Cervical  back: Neck supple.  Skin:    General: Skin is warm and dry.     Capillary Refill: Capillary refill takes less than 2 seconds.     Findings: Lesion present.       Neurological:     General: No focal deficit present.     Mental Status: She is alert and oriented to person, place, and time. Mental status is at baseline.     Cranial Nerves: No dysarthria or facial asymmetry.  Psychiatric:        Mood and Affect: Mood normal.        Speech: Speech normal.        Behavior: Behavior normal.        Thought Content: Thought content normal.        Judgment: Judgment normal.      UC Treatments / Results  Labs (all labs ordered are listed, but only abnormal results are displayed) Labs Reviewed - No data to display  EKG   Radiology No results found.  Procedures Procedures (including critical care time)  Medications Ordered in UC Medications - No data to display  Initial Impression / Assessment and Plan / UC Course  I have reviewed the triage vital signs and the nursing notes.  Pertinent labs & imaging results that were available during my care of the patient were reviewed by me and considered in my medical decision making (see chart for details).   1. Mass of skin of back Unclear etiology of mass. Suspicious for lipoma versus abscess.  Ultimately, this will need ultrasound to investigate further.  She  is established with PCP and will call on Monday next business day to follow-up and discuss this further. Return precautions discussed should the mass increase significantly in size, pain, redness, etc over the weekend.   Counseled patient on potential for adverse effects with medications prescribed/recommended today, strict ER and return-to-clinic precautions discussed, patient verbalized understanding.   Final Clinical Impressions(s) / UC Diagnoses   Final diagnoses:  Mass of skin of back   Discharge Instructions   None    ED Prescriptions   None    PDMP not reviewed  this encounter.   Starlene Eaton, [place] 09/04/23 Jerre Moots

## 2023-09-04 NOTE — ED Triage Notes (Signed)
 Pt brought in by her husband who reports he noticed a "knot" on her back a "couple days ago". Area of swelling noted below right scapula. No drainage, no redness. Denies recent fall/injury. Pt is here in her personal wheelchair. Shakes head "no" when asked if it is painful. Shakes head "yes" when asked if it hurts when it is touched

## 2023-09-08 ENCOUNTER — Other Ambulatory Visit: Payer: Self-pay

## 2023-09-10 DIAGNOSIS — R222 Localized swelling, mass and lump, trunk: Secondary | ICD-10-CM | POA: Diagnosis not present

## 2023-09-23 ENCOUNTER — Other Ambulatory Visit: Payer: Self-pay

## 2023-09-25 ENCOUNTER — Other Ambulatory Visit: Payer: Self-pay | Admitting: Family Medicine

## 2023-09-25 DIAGNOSIS — R229 Localized swelling, mass and lump, unspecified: Secondary | ICD-10-CM

## 2023-09-28 ENCOUNTER — Ambulatory Visit
Admission: RE | Admit: 2023-09-28 | Discharge: 2023-09-28 | Disposition: A | Discharge status: Home / Self Care | Source: Ambulatory Visit | Attending: Family Medicine | Admitting: Family Medicine

## 2023-09-28 DIAGNOSIS — R229 Localized swelling, mass and lump, unspecified: Secondary | ICD-10-CM

## 2023-09-28 DIAGNOSIS — R222 Localized swelling, mass and lump, trunk: Secondary | ICD-10-CM | POA: Diagnosis not present

## 2023-10-06 ENCOUNTER — Other Ambulatory Visit: Payer: Self-pay

## 2023-10-13 ENCOUNTER — Other Ambulatory Visit: Payer: Self-pay

## 2023-10-26 ENCOUNTER — Other Ambulatory Visit: Payer: Self-pay

## 2023-11-03 ENCOUNTER — Other Ambulatory Visit: Payer: Self-pay

## 2023-11-05 ENCOUNTER — Other Ambulatory Visit: Payer: Self-pay

## 2023-11-16 ENCOUNTER — Other Ambulatory Visit: Payer: Self-pay

## 2023-11-17 ENCOUNTER — Other Ambulatory Visit (HOSPITAL_COMMUNITY): Payer: Self-pay

## 2023-11-17 MED ORDER — LAMOTRIGINE 25 MG PO TABS
50.0000 mg | ORAL_TABLET | Freq: Every day | ORAL | 0 refills | Status: DC
  Filled 2023-12-03: qty 60, 30d supply, fill #0

## 2023-11-17 MED ORDER — LEVOTHYROXINE SODIUM 50 MCG PO TABS
50.0000 ug | ORAL_TABLET | Freq: Every day | ORAL | 0 refills | Status: DC
  Filled 2023-12-03: qty 30, 30d supply, fill #0
  Filled 2023-12-30 – 2024-01-05 (×3): qty 30, 30d supply, fill #1
  Filled 2024-02-02: qty 30, 30d supply, fill #2

## 2023-11-18 ENCOUNTER — Other Ambulatory Visit (HOSPITAL_COMMUNITY): Payer: Self-pay

## 2023-11-19 ENCOUNTER — Other Ambulatory Visit: Payer: Self-pay

## 2023-11-24 ENCOUNTER — Other Ambulatory Visit (HOSPITAL_COMMUNITY): Payer: Self-pay

## 2023-11-24 DIAGNOSIS — B079 Viral wart, unspecified: Secondary | ICD-10-CM | POA: Diagnosis not present

## 2023-11-24 DIAGNOSIS — L820 Inflamed seborrheic keratosis: Secondary | ICD-10-CM | POA: Diagnosis not present

## 2023-11-24 DIAGNOSIS — B078 Other viral warts: Secondary | ICD-10-CM | POA: Diagnosis not present

## 2023-11-24 MED ORDER — CIPROFLOXACIN HCL 500 MG PO TABS
500.0000 mg | ORAL_TABLET | Freq: Two times a day (BID) | ORAL | 1 refills | Status: AC
  Filled 2023-11-24: qty 14, 7d supply, fill #0

## 2023-11-30 ENCOUNTER — Other Ambulatory Visit: Payer: Self-pay

## 2023-11-30 ENCOUNTER — Other Ambulatory Visit (HOSPITAL_COMMUNITY): Payer: Self-pay

## 2023-12-03 ENCOUNTER — Other Ambulatory Visit (HOSPITAL_COMMUNITY): Payer: Self-pay

## 2023-12-03 ENCOUNTER — Other Ambulatory Visit: Payer: Self-pay

## 2023-12-04 ENCOUNTER — Other Ambulatory Visit: Payer: Self-pay

## 2023-12-07 ENCOUNTER — Other Ambulatory Visit: Payer: Self-pay

## 2023-12-29 DIAGNOSIS — Z8673 Personal history of transient ischemic attack (TIA), and cerebral infarction without residual deficits: Secondary | ICD-10-CM | POA: Diagnosis not present

## 2023-12-29 DIAGNOSIS — I129 Hypertensive chronic kidney disease with stage 1 through stage 4 chronic kidney disease, or unspecified chronic kidney disease: Secondary | ICD-10-CM | POA: Diagnosis not present

## 2023-12-29 DIAGNOSIS — N1831 Chronic kidney disease, stage 3a: Secondary | ICD-10-CM | POA: Diagnosis not present

## 2023-12-29 DIAGNOSIS — R7301 Impaired fasting glucose: Secondary | ICD-10-CM | POA: Diagnosis not present

## 2023-12-29 DIAGNOSIS — Z0000 Encounter for general adult medical examination without abnormal findings: Secondary | ICD-10-CM | POA: Diagnosis not present

## 2023-12-29 DIAGNOSIS — E871 Hypo-osmolality and hyponatremia: Secondary | ICD-10-CM | POA: Diagnosis not present

## 2023-12-29 DIAGNOSIS — M810 Age-related osteoporosis without current pathological fracture: Secondary | ICD-10-CM | POA: Diagnosis not present

## 2023-12-29 DIAGNOSIS — E785 Hyperlipidemia, unspecified: Secondary | ICD-10-CM | POA: Diagnosis not present

## 2023-12-29 DIAGNOSIS — F319 Bipolar disorder, unspecified: Secondary | ICD-10-CM | POA: Diagnosis not present

## 2023-12-29 DIAGNOSIS — E039 Hypothyroidism, unspecified: Secondary | ICD-10-CM | POA: Diagnosis not present

## 2023-12-29 DIAGNOSIS — Z1331 Encounter for screening for depression: Secondary | ICD-10-CM | POA: Diagnosis not present

## 2023-12-29 DIAGNOSIS — E440 Moderate protein-calorie malnutrition: Secondary | ICD-10-CM | POA: Diagnosis not present

## 2023-12-30 ENCOUNTER — Other Ambulatory Visit: Payer: Self-pay

## 2023-12-31 ENCOUNTER — Other Ambulatory Visit: Payer: Self-pay

## 2024-01-01 ENCOUNTER — Other Ambulatory Visit: Payer: Self-pay

## 2024-01-01 ENCOUNTER — Other Ambulatory Visit (HOSPITAL_COMMUNITY): Payer: Self-pay

## 2024-01-01 MED ORDER — LAMOTRIGINE 25 MG PO TABS
50.0000 mg | ORAL_TABLET | Freq: Every day | ORAL | 0 refills | Status: DC
  Filled 2024-01-01: qty 60, 30d supply, fill #0

## 2024-01-05 ENCOUNTER — Other Ambulatory Visit: Payer: Self-pay

## 2024-01-06 ENCOUNTER — Other Ambulatory Visit: Payer: Self-pay

## 2024-01-12 ENCOUNTER — Other Ambulatory Visit (HOSPITAL_COMMUNITY): Payer: Self-pay

## 2024-01-12 DIAGNOSIS — R3989 Other symptoms and signs involving the genitourinary system: Secondary | ICD-10-CM | POA: Diagnosis not present

## 2024-01-12 MED ORDER — CEPHALEXIN 500 MG PO CAPS
500.0000 mg | ORAL_CAPSULE | Freq: Two times a day (BID) | ORAL | 0 refills | Status: AC
  Filled 2024-01-12: qty 10, 5d supply, fill #0

## 2024-01-25 ENCOUNTER — Emergency Department (HOSPITAL_COMMUNITY)

## 2024-01-25 ENCOUNTER — Encounter (HOSPITAL_COMMUNITY): Payer: Self-pay | Admitting: Radiology

## 2024-01-25 ENCOUNTER — Emergency Department (HOSPITAL_COMMUNITY)
Admission: EM | Admit: 2024-01-25 | Discharge: 2024-01-25 | Disposition: A | Discharge status: Home / Self Care | Attending: Emergency Medicine | Admitting: Emergency Medicine

## 2024-01-25 DIAGNOSIS — W19XXXA Unspecified fall, initial encounter: Secondary | ICD-10-CM | POA: Insufficient documentation

## 2024-01-25 DIAGNOSIS — Z043 Encounter for examination and observation following other accident: Secondary | ICD-10-CM | POA: Diagnosis not present

## 2024-01-25 DIAGNOSIS — R079 Chest pain, unspecified: Secondary | ICD-10-CM | POA: Diagnosis not present

## 2024-01-25 DIAGNOSIS — M47817 Spondylosis without myelopathy or radiculopathy, lumbosacral region: Secondary | ICD-10-CM | POA: Diagnosis not present

## 2024-01-25 DIAGNOSIS — R001 Bradycardia, unspecified: Secondary | ICD-10-CM | POA: Diagnosis not present

## 2024-01-25 DIAGNOSIS — Z79899 Other long term (current) drug therapy: Secondary | ICD-10-CM | POA: Diagnosis not present

## 2024-01-25 DIAGNOSIS — M160 Bilateral primary osteoarthritis of hip: Secondary | ICD-10-CM | POA: Diagnosis not present

## 2024-01-25 DIAGNOSIS — I10 Essential (primary) hypertension: Secondary | ICD-10-CM | POA: Diagnosis not present

## 2024-01-25 DIAGNOSIS — F0390 Unspecified dementia without behavioral disturbance: Secondary | ICD-10-CM | POA: Insufficient documentation

## 2024-01-25 DIAGNOSIS — M25531 Pain in right wrist: Secondary | ICD-10-CM | POA: Diagnosis not present

## 2024-01-25 DIAGNOSIS — R4182 Altered mental status, unspecified: Secondary | ICD-10-CM | POA: Diagnosis not present

## 2024-01-25 DIAGNOSIS — M1811 Unilateral primary osteoarthritis of first carpometacarpal joint, right hand: Secondary | ICD-10-CM | POA: Diagnosis not present

## 2024-01-25 LAB — CBC WITH DIFFERENTIAL/PLATELET
Abs Immature Granulocytes: 0.06 K/uL (ref 0.00–0.07)
Basophils Absolute: 0.1 K/uL (ref 0.0–0.1)
Basophils Relative: 1 %
Eosinophils Absolute: 0.1 K/uL (ref 0.0–0.5)
Eosinophils Relative: 1 %
HCT: 41.8 % (ref 36.0–46.0)
Hemoglobin: 13.5 g/dL (ref 12.0–15.0)
Immature Granulocytes: 1 %
Lymphocytes Relative: 17 %
Lymphs Abs: 1.4 K/uL (ref 0.7–4.0)
MCH: 31 pg (ref 26.0–34.0)
MCHC: 32.3 g/dL (ref 30.0–36.0)
MCV: 95.9 fL (ref 80.0–100.0)
Monocytes Absolute: 0.5 K/uL (ref 0.1–1.0)
Monocytes Relative: 6 %
Neutro Abs: 5.8 K/uL (ref 1.7–7.7)
Neutrophils Relative %: 74 %
Platelets: 353 K/uL (ref 150–400)
RBC: 4.36 MIL/uL (ref 3.87–5.11)
RDW: 13 % (ref 11.5–15.5)
WBC: 7.8 K/uL (ref 4.0–10.5)
nRBC: 0 % (ref 0.0–0.2)

## 2024-01-25 LAB — BASIC METABOLIC PANEL WITH GFR
Anion gap: 12 (ref 5–15)
BUN: 12 mg/dL (ref 8–23)
CO2: 26 mmol/L (ref 22–32)
Calcium: 10.4 mg/dL — ABNORMAL HIGH (ref 8.9–10.3)
Chloride: 92 mmol/L — ABNORMAL LOW (ref 98–111)
Creatinine, Ser: 0.82 mg/dL (ref 0.44–1.00)
GFR, Estimated: >60 mL/min (ref >60)
Glucose, Bld: 126 mg/dL — ABNORMAL HIGH (ref 70–99)
Potassium: 4.3 mmol/L (ref 3.5–5.1)
Sodium: 131 mmol/L — ABNORMAL LOW (ref 135–145)

## 2024-01-25 NOTE — ED Triage Notes (Signed)
 Per EMS from home. Unwitnessed fall. Hx of dementia. No LOC. No blood thinners, but is on antiplatelet.   HR 56 BP 160/60 99 on RA

## 2024-01-25 NOTE — ED Notes (Signed)
 Pt assisted with 2 staff and daughter to restroom then assisted to car with RN

## 2024-01-25 NOTE — ED Notes (Signed)
 Permission given by spouse to sign waiver.

## 2024-01-25 NOTE — ED Provider Notes (Signed)
 [place] EMERGENCY DEPARTMENT AT [facility] Provider Note   CSN: [id number] Arrival date & time: 01/25/24  1538     Patient presents with: Nichole Foster a 83 y.o. female.   Patient here after unwitnessed phone at home.  Patient lives at home with husband who Foster a primary caregiver.  She has dementia.  She Foster on Aggrenox .  She was on the toilet and she supposed to use a walker or wheelchair to get around but she had gotten up and walked out of the bathroom and ended up falling.  He did not see the fall.  Does not seem like she lost consciousness.  She Foster not endorsing any pain.  She has been in her normal state of health otherwise.  He has been doing well taking care of her status.  The history Foster provided by the patient.       Prior to Admission medications   Medication Sig Start Date End Date Taking? Authorizing Provider  alendronate  (FOSAMAX ) 70 MG tablet Take 70 mg by mouth every Friday. Take with a full glass of water on an empty stomach. Patient not taking: Reported on 09/04/2023    [provider]  amLODipine  (NORVASC ) 10 MG tablet Take 10 mg by mouth daily. 04/24/16   [provider]  amLODipine  (NORVASC ) 10 MG tablet Take 1 tablet (10 mg total) by mouth in the morning. 05/04/23     amLODipine  (NORVASC ) 10 MG tablet Take 1 tablet (10 mg total) by mouth in the morning. 05/23/23     Calcium  Carb-Cholecalciferol  600-20 MG-MCG CHEW Chew 1 tablet by mouth daily.    [provider]  cephALEXin  (KEFLEX ) 500 MG capsule Take 1 capsule (500 mg total) by mouth 2 (two) times daily. 01/12/24     ciprofloxacin  (CIPRO ) 500 MG tablet Take 1 tablet (500 mg total) by mouth 2 (two) times daily with food and water for 7 days 11/24/23   Shona Rush, MD  CRANBERRY PO Take 500 mg by mouth daily.    [provider]  diclofenac  Sodium (VOLTAREN ) 1 % GEL Apply 2 g topically 4 (four) times daily. Patient not taking: Reported on 09/04/2023 11/01/21    Blaise Aleene KIDD, MD  dipyridamole -aspirin  (AGGRENOX ) 200-25 MG 12hr capsule Take 1 capsule by mouth twice daily 05/23/23     dipyridamole -aspirin  (AGGRENOX ) 200-25 MG per 12 hr capsule Take 1 capsule by mouth 2 (two) times daily.    [provider]  docusate sodium  (COLACE) 100 MG capsule Take 100 mg by mouth 2 (two) times daily.    [provider]  ESTRACE VAGINAL 0.1 MG/GM vaginal cream Place 2 g vaginally daily as needed (dryness). Patient not taking: Reported on 09/04/2023 02/02/13   [provider]  fluticasone  (FLONASE ) 50 MCG/ACT nasal spray Place 2 sprays into the nose daily.    [provider]  hydrocortisone  (ANUSOL -HC) 2.5 % rectal cream Place 1 application rectally 2 (two) times daily. Patient taking differently: Place 1 application  rectally daily as needed for hemorrhoids or anal itching. 10/22/20   Caccavale, Sophia, PA-C  IRON PO Take 45 mg by mouth every evening.    [provider]  lamoTRIgine  (LAMICTAL ) 25 MG tablet Take 50 mg by mouth daily before breakfast. 05/09/16   [provider]  lamoTRIgine  (LAMICTAL ) 25 MG tablet Take 2 tablets (50 mg total) by mouth daily with breakfast. 05/23/23     lamoTRIgine  (LAMICTAL ) 25 MG tablet Take 2 tablets (  50 mg total) by mouth daily with breakfast. 01/01/24     levocetirizine (XYZAL) 5 MG tablet Take 5 mg by mouth every evening.    [provider]  levothyroxine  (SYNTHROID ) 50 MCG tablet Take 1 tablet (50 mcg total) by mouth daily before breakfast. 11/17/23     levothyroxine  (SYNTHROID , LEVOTHROID) 50 MCG tablet Take 50 mcg by mouth daily before breakfast.    [provider]  lisinopril  (PRINIVIL ,ZESTRIL ) 20 MG tablet Take 1 tablet (20 mg total) by mouth daily. Patient taking differently: Take 40 mg by mouth every morning. 02/13/13   Gabriel Noa, MD  lisinopril  (ZESTRIL ) 40 MG tablet Take 1 tablet (40 mg total) by mouth in the morning. 05/23/23     magnesium  oxide (MAG-OX)  400 (240 Mg) MG tablet Take 1 tablet (400 mg total) by mouth daily. Patient not taking: Reported on 09/04/2023 03/19/21   Thomas, Alicia, MD  Multiple Vitamin (MULTIVITAMIN WITH MINERALS) TABS tablet Take 1 tablet by mouth daily. 50 plus    [provider]  Naphazoline-Pheniramine (OPCON-A ) 0.027-0.315 % SOLN Place 1 drop into both eyes daily as needed (Dry eyes).    [provider]  Omega-3 Fatty Acids (FISH OIL) 500 MG CAPS Take 500 mg by mouth daily.    [provider]  polyethylene glycol (MIRALAX  / GLYCOLAX ) packet Take 17 g by mouth daily as needed. Patient taking differently: Take 17 g by mouth daily. 02/28/13   Akula, Vijaya, MD  QUEtiapine  (SEROQUEL ) 100 MG tablet Take 100 mg by mouth at bedtime. 04/22/16   [provider]  QUEtiapine  (SEROQUEL ) 100 MG tablet Take 0.5 tablets (50 mg total) by mouth at bedtime. 01/06/23     QUEtiapine  (SEROQUEL ) 50 MG tablet Take 1 tablet (50 mg total) by mouth at bedtime. 04/15/23     QUEtiapine  (SEROQUEL ) 50 MG tablet Take 1 tablet (50 mg total) by mouth at bedtime. 05/23/23     simvastatin  (ZOCOR ) 20 MG tablet Take 20 mg by mouth every evening. 10/11/20   [provider]  simvastatin  (ZOCOR ) 20 MG tablet Take 1 tablet (20 mg total) by mouth every evening. 05/23/23     traMADol  (ULTRAM ) 50 MG tablet Take 1 tablet (50 mg total) by mouth every 6 (six) hours as needed for moderate pain. Patient not taking: Reported on 09/04/2023 03/19/21   Debby Hila, MD    Allergies: Codeine, Sulfa antibiotics, and Latex    Review of Systems  Updated Vital Signs BP (!) 152/63 (BP Location: Left Arm)   Pulse 62   Temp 98.2 F (36.8 C) (Oral)   Resp 15   SpO2 98%   Physical Exam Vitals and nursing note reviewed.  Constitutional:      General: She Foster not in acute distress.    Appearance: She Foster well-developed. She Foster not ill-appearing.  HENT:     Head: Normocephalic and atraumatic.     Nose: Nose normal.      Mouth/Throat:     Mouth: Mucous membranes are moist.  Eyes:     Extraocular Movements: Extraocular movements intact.     Conjunctiva/sclera: Conjunctivae normal.     Pupils: Pupils are equal, round, and reactive to light.  Neck:     Comments: In cervical collar, no midline spinal tenderness Cardiovascular:     Rate and Rhythm: Normal rate and regular rhythm.     Pulses: Normal pulses.     Heart sounds: Normal heart sounds. No murmur heard. Pulmonary:     Effort:  Pulmonary effort Foster normal. No respiratory distress.     Breath sounds: Normal breath sounds.  Abdominal:     Palpations: Abdomen Foster soft.     Tenderness: There Foster no abdominal tenderness.  Musculoskeletal:        General: Tenderness present.     Comments: Some tenderness to the right wrist no obvious deformity  Skin:    General: Skin Foster warm and dry.     Capillary Refill: Capillary refill takes less than 2 seconds.  Neurological:     General: No focal deficit present.     Mental Status: She Foster alert.     Sensory: No sensory deficit.     Motor: No weakness.     (all labs ordered are listed, but only abnormal results are displayed) Labs Reviewed  BASIC METABOLIC PANEL WITH GFR - Abnormal; Notable for the following components:      Result Value   Sodium 131 (*)    Chloride 92 (*)    Glucose, Bld 126 (*)    Calcium  10.4 (*)    All other components within normal limits  CBC WITH DIFFERENTIAL/PLATELET    EKG: EKG Interpretation Date/Time:  Monday January 25 2024 16:10:13 EDT Ventricular Rate:  54 PR Interval:  171 QRS Duration:  82 QT Interval:  438 QTC Calculation: 416 R Axis:   9  Text Interpretation: Sinus rhythm Atrial premature complex Confirmed by Ruthe Cornet [phone]) on 01/25/2024 4:51:29 PM  Radiology: CT Head Wo Contrast Result Date: 01/25/2024 CLINICAL DATA:  Unwitnessed fall EXAM: CT HEAD WITHOUT CONTRAST CT CERVICAL SPINE WITHOUT CONTRAST TECHNIQUE: Multidetector CT imaging of the head and  cervical spine was performed following the standard protocol without intravenous contrast. Multiplanar CT image reconstructions of the cervical spine were also generated. RADIATION DOSE REDUCTION: This exam was performed according to the departmental dose-optimization program which includes automated exposure control, adjustment of the mA and/or kV according to patient size and/or use of iterative reconstruction technique. COMPARISON:  CT brain and cervical spine 03/31/2023 FINDINGS: CT HEAD FINDINGS Brain: No acute territorial infarction, hemorrhage or intracranial mass. Chronic right occipital infarcts. Chronic left basal ganglial infarcts. Atrophy and chronic small vessel ischemic changes of the white matter. The ventricles are non enlarged Vascular: No hyperdense vessels. Vertebral and carotid vascular calcification Skull: No fracture Sinuses/Orbits: No acute finding. Other: None CT CERVICAL SPINE FINDINGS Alignment: No subluxation.  Facet alignment Foster within normal limits. Skull base and vertebrae: No acute fracture. No primary bone lesion or focal pathologic process. Soft tissues and spinal canal: No prevertebral fluid or swelling. No visible canal hematoma. Disc levels: Relatively patent disc spaces. Posterior disc osteophyte at C3-C4 with indentation of thecal sac. Disc protrusions at C4-C5 and C5-C6 with mild mass effect on the anterior thecal sac. No high-grade canal stenosis. Upper chest: Negative. Other: None IMPRESSION: 1. No CT evidence for acute intracranial abnormality. Atrophy and chronic small vessel ischemic changes of the white matter. Chronic right occipital and left basal ganglial infarcts. 2. Degenerative changes of the cervical spine. No acute osseous abnormality. Electronically Signed   By: Luke Bun M.D.   On: 01/25/2024 17:45   CT Cervical Spine Wo Contrast Result Date: 01/25/2024 CLINICAL DATA:  Unwitnessed fall EXAM: CT HEAD WITHOUT CONTRAST CT CERVICAL SPINE WITHOUT CONTRAST  TECHNIQUE: Multidetector CT imaging of the head and cervical spine was performed following the standard protocol without intravenous contrast. Multiplanar CT image reconstructions of the cervical spine were also generated. RADIATION DOSE REDUCTION: This exam  was performed according to the departmental dose-optimization program which includes automated exposure control, adjustment of the mA and/or kV according to patient size and/or use of iterative reconstruction technique. COMPARISON:  CT brain and cervical spine 03/31/2023 FINDINGS: CT HEAD FINDINGS Brain: No acute territorial infarction, hemorrhage or intracranial mass. Chronic right occipital infarcts. Chronic left basal ganglial infarcts. Atrophy and chronic small vessel ischemic changes of the white matter. The ventricles are non enlarged Vascular: No hyperdense vessels. Vertebral and carotid vascular calcification Skull: No fracture Sinuses/Orbits: No acute finding. Other: None CT CERVICAL SPINE FINDINGS Alignment: No subluxation.  Facet alignment Foster within normal limits. Skull base and vertebrae: No acute fracture. No primary bone lesion or focal pathologic process. Soft tissues and spinal canal: No prevertebral fluid or swelling. No visible canal hematoma. Disc levels: Relatively patent disc spaces. Posterior disc osteophyte at C3-C4 with indentation of thecal sac. Disc protrusions at C4-C5 and C5-C6 with mild mass effect on the anterior thecal sac. No high-grade canal stenosis. Upper chest: Negative. Other: None IMPRESSION: 1. No CT evidence for acute intracranial abnormality. Atrophy and chronic small vessel ischemic changes of the white matter. Chronic right occipital and left basal ganglial infarcts. 2. Degenerative changes of the cervical spine. No acute osseous abnormality. Electronically Signed   By: Luke Bun M.D.   On: 01/25/2024 17:45   DG Chest Portable 1 View Result Date: 01/25/2024 CLINICAL DATA:  Unwitnessed fall.  Chest pain EXAM:  PORTABLE CHEST 1 VIEW COMPARISON:  01/09/2017 FINDINGS: Lungs are adequately inflated without focal airspace consolidation or effusion. Skin fold projects over the lateral aspect of the left lung. Cardiomediastinal silhouette and remainder of the exam Foster unchanged. IMPRESSION: No acute cardiopulmonary disease. Electronically Signed   By: Toribio Agreste M.D.   On: 01/25/2024 17:21   DG Wrist Complete Right Result Date: 01/25/2024 CLINICAL DATA:  Unwitnessed fall. EXAM: RIGHT WRIST - COMPLETE 3+ VIEW COMPARISON:  None Available. FINDINGS: There are degenerative changes over the scaphoid trapezium joint and first carpometacarpal joint. No acute fracture or dislocation at the wrist. Tiny bony fragment adjacent the radial aspect of the base of the fifth proximal phalanx likely chronic. IMPRESSION: 1. No acute findings. 2. Degenerative changes as described. Electronically Signed   By: Toribio Agreste M.D.   On: 01/25/2024 17:19   DG Pelvis Portable Result Date: 01/25/2024 CLINICAL DATA:  Unwitnessed fall. EXAM: PORTABLE PELVIS 1-2 VIEWS COMPARISON:  04/01/2023 FINDINGS: Mild osteoarthritic change of the hips which Foster unchanged. Mild diffuse decreased bone mineralization Foster present. There Foster no acute fracture or dislocation involving the hips. Remaining pelvic bones are unremarkable. Degenerative change of the spine. IMPRESSION: 1. No acute findings. 2. Mild osteoarthritic change of the hips. Electronically Signed   By: Toribio Agreste M.D.   On: 01/25/2024 17:18     Procedures   Medications Ordered in the ED - No data to display                                  Medical Decision Making Amount and/or Complexity of Data Reviewed Labs: ordered. Radiology: ordered.   Nichole Foster Foster here after a witnessed fall with caregiver.  History of dementia.  Foster on Aggrenox .  Planing of maybe some right wrist pain on palpation on exam but otherwise no discomfort.  No obvious deformity to the right wrist.  She Foster in a  cervical collar.  Will get a CT scan  of her head and neck chest x-ray pelvic x-ray check basic labs and reevaluate.  She seems to be at her baseline.  Unremarkable vitals.  Patient Foster unremarkable per radiology report.  Lab work per my review and interpretation showed no significant leukocytosis anemia or electro abnormality.  Overall no injury.  Discharged in good condition.  Understands return precautions.  Recommend Tylenol  supportive care at home.  This chart was dictated using voice recognition software.  Despite best efforts to proofread,  errors can occur which can change the documentation meaning.      Final diagnoses:  Fall, initial encounter    ED Discharge Orders     None          Ruthe Cornet, DO 01/25/24 1908

## 2024-01-27 ENCOUNTER — Other Ambulatory Visit (HOSPITAL_COMMUNITY): Payer: Self-pay

## 2024-01-27 ENCOUNTER — Other Ambulatory Visit: Payer: Self-pay

## 2024-01-28 ENCOUNTER — Other Ambulatory Visit (HOSPITAL_COMMUNITY): Payer: Self-pay

## 2024-01-28 ENCOUNTER — Other Ambulatory Visit: Payer: Self-pay

## 2024-01-28 MED ORDER — LAMOTRIGINE 25 MG PO TABS
50.0000 mg | ORAL_TABLET | Freq: Every day | ORAL | 0 refills | Status: DC
  Filled 2024-01-28 – 2024-02-02 (×2): qty 60, 30d supply, fill #0

## 2024-02-02 ENCOUNTER — Other Ambulatory Visit (HOSPITAL_COMMUNITY): Payer: Self-pay

## 2024-02-02 ENCOUNTER — Other Ambulatory Visit: Payer: Self-pay

## 2024-02-03 ENCOUNTER — Other Ambulatory Visit: Payer: Self-pay

## 2024-02-26 ENCOUNTER — Other Ambulatory Visit: Payer: Self-pay

## 2024-02-27 ENCOUNTER — Other Ambulatory Visit (HOSPITAL_COMMUNITY): Payer: Self-pay

## 2024-02-27 MED ORDER — LAMOTRIGINE 25 MG PO TABS
50.0000 mg | ORAL_TABLET | Freq: Every day | ORAL | 0 refills | Status: DC
  Filled 2024-02-27 – 2024-03-08 (×2): qty 60, 30d supply, fill #0

## 2024-02-27 MED ORDER — LEVOTHYROXINE SODIUM 50 MCG PO TABS
50.0000 ug | ORAL_TABLET | Freq: Every day | ORAL | 0 refills | Status: AC
  Filled 2024-02-27: qty 90, 90d supply, fill #0
  Filled 2024-03-08: qty 30, 30d supply, fill #0
  Filled 2024-04-05: qty 30, 30d supply, fill #1
  Filled 2024-05-06: qty 30, 30d supply, fill #2

## 2024-02-28 ENCOUNTER — Other Ambulatory Visit (HOSPITAL_COMMUNITY): Payer: Self-pay

## 2024-03-01 ENCOUNTER — Other Ambulatory Visit: Payer: Self-pay

## 2024-03-02 ENCOUNTER — Other Ambulatory Visit: Payer: Self-pay

## 2024-03-08 ENCOUNTER — Other Ambulatory Visit: Payer: Self-pay

## 2024-03-08 ENCOUNTER — Other Ambulatory Visit (HOSPITAL_COMMUNITY): Payer: Self-pay

## 2024-03-09 ENCOUNTER — Other Ambulatory Visit: Payer: Self-pay

## 2024-03-10 ENCOUNTER — Other Ambulatory Visit: Payer: Self-pay

## 2024-03-15 ENCOUNTER — Other Ambulatory Visit: Payer: Self-pay

## 2024-03-22 ENCOUNTER — Other Ambulatory Visit: Payer: Self-pay

## 2024-03-23 ENCOUNTER — Other Ambulatory Visit: Payer: Self-pay

## 2024-03-28 ENCOUNTER — Other Ambulatory Visit (HOSPITAL_COMMUNITY): Payer: Self-pay

## 2024-03-28 MED ORDER — LAMOTRIGINE 25 MG PO TABS
50.0000 mg | ORAL_TABLET | Freq: Every day | ORAL | 1 refills | Status: AC
  Filled 2024-03-28 – 2024-04-05 (×2): qty 60, 30d supply, fill #0
  Filled 2024-05-06: qty 60, 30d supply, fill #1

## 2024-03-29 ENCOUNTER — Other Ambulatory Visit (HOSPITAL_COMMUNITY): Payer: Self-pay

## 2024-03-30 ENCOUNTER — Other Ambulatory Visit: Payer: Self-pay

## 2024-03-31 ENCOUNTER — Other Ambulatory Visit (HOSPITAL_COMMUNITY): Payer: Self-pay

## 2024-04-01 ENCOUNTER — Other Ambulatory Visit: Payer: Self-pay

## 2024-04-05 ENCOUNTER — Other Ambulatory Visit: Payer: Self-pay

## 2024-04-05 ENCOUNTER — Other Ambulatory Visit (HOSPITAL_COMMUNITY): Payer: Self-pay

## 2024-04-06 ENCOUNTER — Other Ambulatory Visit (HOSPITAL_COMMUNITY): Payer: Self-pay

## 2024-04-06 ENCOUNTER — Other Ambulatory Visit: Payer: Self-pay

## 2024-04-07 ENCOUNTER — Other Ambulatory Visit: Payer: Self-pay

## 2024-04-07 ENCOUNTER — Other Ambulatory Visit (HOSPITAL_COMMUNITY): Payer: Self-pay

## 2024-04-08 ENCOUNTER — Other Ambulatory Visit: Payer: Self-pay

## 2024-04-11 ENCOUNTER — Other Ambulatory Visit: Payer: Self-pay

## 2024-04-19 ENCOUNTER — Other Ambulatory Visit (HOSPITAL_COMMUNITY): Payer: Self-pay

## 2024-05-06 ENCOUNTER — Other Ambulatory Visit: Payer: Self-pay

## 2024-05-09 ENCOUNTER — Other Ambulatory Visit: Payer: Self-pay

## 2024-05-09 ENCOUNTER — Other Ambulatory Visit (HOSPITAL_COMMUNITY): Payer: Self-pay

## 2024-05-09 MED ORDER — AMLODIPINE BESYLATE 10 MG PO TABS
10.0000 mg | ORAL_TABLET | Freq: Every morning | ORAL | 0 refills | Status: AC
  Filled 2024-05-09: qty 30, 30d supply, fill #0

## 2024-05-10 ENCOUNTER — Other Ambulatory Visit (HOSPITAL_COMMUNITY): Payer: Self-pay

## 2024-05-11 ENCOUNTER — Other Ambulatory Visit (HOSPITAL_COMMUNITY): Payer: Self-pay

## 2024-05-12 ENCOUNTER — Other Ambulatory Visit: Payer: Self-pay

## 2024-05-19 ENCOUNTER — Emergency Department (HOSPITAL_COMMUNITY)
Admission: EM | Admit: 2024-05-19 | Discharge: 2024-05-20 | Disposition: A | Discharge status: Home / Self Care | Attending: Emergency Medicine | Admitting: Emergency Medicine

## 2024-05-19 ENCOUNTER — Other Ambulatory Visit: Payer: Self-pay

## 2024-05-19 ENCOUNTER — Emergency Department (HOSPITAL_COMMUNITY)

## 2024-05-19 DIAGNOSIS — K5900 Constipation, unspecified: Secondary | ICD-10-CM | POA: Diagnosis not present

## 2024-05-19 DIAGNOSIS — R112 Nausea with vomiting, unspecified: Secondary | ICD-10-CM | POA: Insufficient documentation

## 2024-05-19 DIAGNOSIS — R778 Other specified abnormalities of plasma proteins: Secondary | ICD-10-CM | POA: Insufficient documentation

## 2024-05-19 DIAGNOSIS — E860 Dehydration: Secondary | ICD-10-CM | POA: Diagnosis not present

## 2024-05-19 LAB — COMPREHENSIVE METABOLIC PANEL WITH GFR
ALT: 20 U/L (ref 0–44)
AST: 24 U/L (ref 15–41)
Albumin: 4.1 g/dL (ref 3.5–5.0)
Alkaline Phosphatase: 108 U/L (ref 38–126)
Anion gap: 10 (ref 5–15)
BUN: 19 mg/dL (ref 8–23)
CO2: 27 mmol/L (ref 22–32)
Calcium: 9.6 mg/dL (ref 8.9–10.3)
Chloride: 96 mmol/L — ABNORMAL LOW (ref 98–111)
Creatinine, Ser: 1.06 mg/dL — ABNORMAL HIGH (ref 0.44–1.00)
GFR, Estimated: 52 mL/min — ABNORMAL LOW
Glucose, Bld: 153 mg/dL — ABNORMAL HIGH (ref 70–99)
Potassium: 4.6 mmol/L (ref 3.5–5.1)
Sodium: 133 mmol/L — ABNORMAL LOW (ref 135–145)
Total Bilirubin: 0.2 mg/dL (ref 0.0–1.2)
Total Protein: 7.2 g/dL (ref 6.5–8.1)

## 2024-05-19 LAB — CBC
HCT: 37.1 % (ref 36.0–46.0)
Hemoglobin: 12 g/dL (ref 12.0–15.0)
MCH: 31.5 pg (ref 26.0–34.0)
MCHC: 32.3 g/dL (ref 30.0–36.0)
MCV: 97.4 fL (ref 80.0–100.0)
Platelets: 443 K/uL — ABNORMAL HIGH (ref 150–400)
RBC: 3.81 MIL/uL — ABNORMAL LOW (ref 3.87–5.11)
RDW: 13.4 % (ref 11.5–15.5)
WBC: 11.1 K/uL — ABNORMAL HIGH (ref 4.0–10.5)
nRBC: 0 % (ref 0.0–0.2)

## 2024-05-19 LAB — LIPASE, BLOOD: Lipase: 57 U/L — ABNORMAL HIGH (ref 11–51)

## 2024-05-19 LAB — TROPONIN T, HIGH SENSITIVITY: Troponin T High Sensitivity: 27 ng/L — ABNORMAL HIGH (ref 0–19)

## 2024-05-19 MED ORDER — ONDANSETRON HCL 4 MG/2ML IJ SOLN
4.0000 mg | Freq: Once | INTRAMUSCULAR | Status: AC
  Administered 2024-05-19: 4 mg via INTRAVENOUS
  Filled 2024-05-19: qty 2

## 2024-05-19 MED ORDER — FENTANYL CITRATE (PF) 50 MCG/ML IJ SOSY
50.0000 ug | PREFILLED_SYRINGE | Freq: Once | INTRAMUSCULAR | Status: AC
  Administered 2024-05-19: 50 ug via INTRAVENOUS
  Filled 2024-05-19: qty 1

## 2024-05-19 NOTE — ED Provider Notes (Signed)
 " [facility]GENC[facility] DEPT [facility] Emergency Department Provider Note MRN:  [id number]  Arrival date & time: 05/20/24     Chief Complaint   Emesis and Abdominal Pain   History of Present Illness   Nichole Foster is a 84 y.o. year-old female presents to the ED with chief complaint of abdominal pain.  Per family, doesn't really say much at baseline and doesn't answer questions.  Family concerned about sudden onset vomiting that started tonight.  Patient curled up on the right side and does exhibit guarding of the abdomen.  Had by fine prior to vomiting this evening.  No reported fevers, CP, SOB, or diarrhea.  History provided by patient.   Review of Systems  Pertinent positive and negative review of systems noted in HPI.    Physical Exam   Vitals:   05/20/24 0030 05/20/24 0130  BP:  (!) 132/59  Pulse: (!) 57 65  Resp: 18 20  Temp:    SpO2: 97% 100%    CONSTITUTIONAL:  uncomfortable-appearing, NAD NEURO:  Alert and oriented x 3, CN 3-12 grossly intact EYES:  eyes equal and reactive ENT/NECK:  Supple, no stridor  CARDIO:  normal rate, regular rhythm, appears well-perfused  PULM:  No respiratory distress, CTAB GI/GU:  non-distended, guarding present MSK/SPINE:  No gross deformities, no edema, moves all extremities  SKIN:  no rash, atraumatic   *Additional and/or pertinent findings included in MDM below  Diagnostic and Interventional Summary    EKG Interpretation Date/Time:  Thursday May 19 2024 22:15:49 EST Ventricular Rate:  63 PR Interval:  144 QRS Duration:  88 QT Interval:  421 QTC Calculation: 431 R Axis:   70  Text Interpretation: Sinus rhythm Atrial premature complexes Low voltage, precordial leads Confirmed by Griselda Norris [phone]) on 05/20/2024 2:22:58 AM       Labs Reviewed  LIPASE, BLOOD - Abnormal; Notable for the following components:      Result Value   Lipase 57 (*)    All other components within normal limits  COMPREHENSIVE METABOLIC  PANEL WITH GFR - Abnormal; Notable for the following components:   Sodium 133 (*)    Chloride 96 (*)    Glucose, Bld 153 (*)    Creatinine, Ser 1.06 (*)    GFR, Estimated 52 (*)    All other components within normal limits  CBC - Abnormal; Notable for the following components:   WBC 11.1 (*)    RBC 3.81 (*)    Platelets 443 (*)    All other components within normal limits  URINALYSIS, ROUTINE W REFLEX MICROSCOPIC - Abnormal; Notable for the following components:   APPearance CLOUDY (*)    Leukocytes,Ua TRACE (*)    All other components within normal limits  TROPONIN T, HIGH SENSITIVITY - Abnormal; Notable for the following components:   Troponin T High Sensitivity 27 (*)    All other components within normal limits  TROPONIN T, HIGH SENSITIVITY - Abnormal; Notable for the following components:   Troponin T High Sensitivity 25 (*)    All other components within normal limits    CT ABDOMEN PELVIS W CONTRAST  Final Result    DG Chest Port 1 View  Final Result      Medications  ondansetron  (ZOFRAN ) injection 4 mg (4 mg Intravenous Given 05/19/24 2250)  fentaNYL  (SUBLIMAZE ) injection 50 mcg (50 mcg Intravenous Given 05/19/24 2251)  iohexol  (OMNIPAQUE ) 300 MG/ML solution 75 mL (75 mLs Intravenous Contrast Given 05/20/24 0043)  sodium chloride  0.9 %  bolus 500 mL (500 mLs Intravenous New Bag/Given 05/20/24 0254)  lactulose  (CHRONULAC ) 10 GM/15ML solution 10 g (10 g Oral Given 05/20/24 0254)     Procedures  /  Critical Care Procedures  ED Course and Medical Decision Making  I have reviewed the triage vital signs, the nursing notes, and pertinent available records from the EMR.  Social Determinants Affecting Complexity of Care: Patient has no clinically significant social determinants affecting this chief complaint..   ED Course:    Medical Decision Making Patient presenting with sudden episode of nausea and vomiting earlier this evening.  Symptoms have now improved.  Family  members concerned because this is abnormal for patient.  She is afebrile.  Normotensive.  Not in any obvious distress.  She does have some tenderness to the belly and some guarding.  Will check labs and CT abdomen/pelvis.  Initial labs are notable for mildly elevated troponin at 27, this will need to be repeated.  Will add EKG and chest x-ray.  No obvious dysrhythmias or acute ischemic changes seen.  Chest x-ray reassuring.  Mild leukocytosis to 11.1, CMP shows signs consistent with mild dehydration, will give liter of fluid.  CT shows some evidence of constipation, will give some lactulose  per Dr. Griselda.  Repeat troponin is 25, doubt ACS.  Urinalysis is not consistent with infection.  Given patient's reassuring workup, the fact that she is tolerating p.o., feel that she can be safely discharged home.  Family understands and agrees to plan.  Amount and/or Complexity of Data Reviewed Labs: ordered. Radiology: ordered.  Risk Prescription drug management.         Consultants: No consultations were needed in caring for this patient.   Treatment and Plan: I considered admission due to patient's initial presentation, but after considering the examination and diagnostic results, patient will not require admission and can be discharged with outpatient follow-up.  Patient seen by and discussed with attending physician, Dr. Griselda, who agrees with plan.  Final Clinical Impressions(s) / ED Diagnoses     ICD-10-CM   1. Nausea and vomiting, unspecified vomiting type  R11.2     2. Constipation, unspecified constipation type  K59.00       ED Discharge Orders          Ordered    lactulose  (CHRONULAC ) 10 GM/15ML solution  Daily PRN        05/20/24 0246              Discharge Instructions Discussed with and Provided to Patient:   Discharge Instructions   None      Vicky Charleston, PA-C 05/20/24 KAROL Griselda Norris, MD 05/20/24 [phone]  "

## 2024-05-19 NOTE — ED Triage Notes (Addendum)
 Patient BIB EMS from home c/o Abdominal pain started tonight. Per report patient vomiting x 3 tonight. Family report patient vomited after eating sandwich with tomato. Family report she's been fine all day. Family denies any diarrhea. Patient denies Chest pain and SOB.  BP 150/90 HR 93 RR 20 O2sat 99%on RA

## 2024-05-20 ENCOUNTER — Emergency Department (HOSPITAL_COMMUNITY)

## 2024-05-20 ENCOUNTER — Other Ambulatory Visit (HOSPITAL_COMMUNITY): Payer: Self-pay

## 2024-05-20 LAB — URINALYSIS, ROUTINE W REFLEX MICROSCOPIC
Bacteria, UA: NONE SEEN
Bilirubin Urine: NEGATIVE
Glucose, UA: NEGATIVE mg/dL
Hgb urine dipstick: NEGATIVE
Ketones, ur: NEGATIVE mg/dL
Nitrite: NEGATIVE
Protein, ur: NEGATIVE mg/dL
Specific Gravity, Urine: 1.018 (ref 1.005–1.030)
pH: 7 (ref 5.0–8.0)

## 2024-05-20 LAB — TROPONIN T, HIGH SENSITIVITY: Troponin T High Sensitivity: 25 ng/L — ABNORMAL HIGH (ref 0–19)

## 2024-05-20 MED ORDER — LACTULOSE 10 GM/15ML PO SOLN
10.0000 g | Freq: Every day | ORAL | 0 refills | Status: AC | PRN
  Filled 2024-05-20: qty 236, 15d supply, fill #0

## 2024-05-20 MED ORDER — SODIUM CHLORIDE 0.9 % IV BOLUS
500.0000 mL | Freq: Once | INTRAVENOUS | Status: AC
  Administered 2024-05-20: 500 mL via INTRAVENOUS

## 2024-05-20 MED ORDER — LACTULOSE 10 GM/15ML PO SOLN
10.0000 g | Freq: Once | ORAL | Status: AC
  Administered 2024-05-20: 10 g via ORAL
  Filled 2024-05-20: qty 30

## 2024-05-20 MED ORDER — IOHEXOL 300 MG/ML  SOLN
75.0000 mL | Freq: Once | INTRAMUSCULAR | Status: AC | PRN
  Administered 2024-05-20: 75 mL via INTRAVENOUS

## 2024-05-20 NOTE — ED Notes (Signed)
 Pt. Soiled brief with very small, brown bm. Pt. Cleaned up with soap on washcloth and dryed. Pt. New brief applied with 3 assist.
